# Patient Record
Sex: Female | Born: 1940 | Race: White | Hispanic: No | Marital: Married | Smoking: Never smoker
Health system: Southern US, Community
[De-identification: ages and names within clinical notes are randomized; demographics above are authoritative.]

## PROBLEM LIST (undated history)

## (undated) DIAGNOSIS — Z96619 Presence of unspecified artificial shoulder joint: Secondary | ICD-10-CM

## (undated) DIAGNOSIS — E039 Hypothyroidism, unspecified: Secondary | ICD-10-CM

## (undated) DIAGNOSIS — Z8719 Personal history of other diseases of the digestive system: Secondary | ICD-10-CM

## (undated) DIAGNOSIS — I10 Essential (primary) hypertension: Secondary | ICD-10-CM

## (undated) DIAGNOSIS — E119 Type 2 diabetes mellitus without complications: Secondary | ICD-10-CM

## (undated) DIAGNOSIS — E7800 Pure hypercholesterolemia, unspecified: Secondary | ICD-10-CM

## (undated) DIAGNOSIS — M1990 Unspecified osteoarthritis, unspecified site: Secondary | ICD-10-CM

## (undated) DIAGNOSIS — E079 Disorder of thyroid, unspecified: Secondary | ICD-10-CM

## (undated) HISTORY — DX: Presence of unspecified artificial shoulder joint: Z96.619

## (undated) HISTORY — PX: CHOLECYSTECTOMY: SHX55

## (undated) HISTORY — DX: Pure hypercholesterolemia, unspecified: E78.00

## (undated) HISTORY — PX: CATARACT EXTRACTION: SUR2

---

## 1981-10-26 HISTORY — PX: CHOLECYSTECTOMY: SHX55

## 2014-10-26 HISTORY — PX: CATARACT EXTRACTION: SUR2

## 2015-10-27 HISTORY — PX: TOTAL KNEE ARTHROPLASTY: SHX125

## 2016-10-26 HISTORY — PX: COLONOSCOPY: SHX174

## 2020-10-26 HISTORY — PX: DIAGNOSTIC MAMMOGRAM: HXRAD719

## 2021-10-26 HISTORY — PX: DG PORTABLE CHEST X RAY (ARMC HX): HXRAD1591

## 2022-02-03 ENCOUNTER — Other Ambulatory Visit: Payer: Self-pay

## 2022-02-03 ENCOUNTER — Emergency Department (HOSPITAL_BASED_OUTPATIENT_CLINIC_OR_DEPARTMENT_OTHER): Payer: MEDICARE | Admitting: Radiology

## 2022-02-03 ENCOUNTER — Emergency Department (HOSPITAL_BASED_OUTPATIENT_CLINIC_OR_DEPARTMENT_OTHER)
Admission: EM | Admit: 2022-02-03 | Discharge: 2022-02-03 | Disposition: A | Discharge status: Home / Self Care | Payer: MEDICARE | Attending: Emergency Medicine | Admitting: Emergency Medicine

## 2022-02-03 ENCOUNTER — Encounter (HOSPITAL_BASED_OUTPATIENT_CLINIC_OR_DEPARTMENT_OTHER): Payer: Self-pay | Admitting: Obstetrics and Gynecology

## 2022-02-03 DIAGNOSIS — M25511 Pain in right shoulder: Secondary | ICD-10-CM | POA: Diagnosis not present

## 2022-02-03 DIAGNOSIS — W010XXA Fall on same level from slipping, tripping and stumbling without subsequent striking against object, initial encounter: Secondary | ICD-10-CM | POA: Diagnosis not present

## 2022-02-03 DIAGNOSIS — S2001XA Contusion of right breast, initial encounter: Secondary | ICD-10-CM | POA: Diagnosis not present

## 2022-02-03 DIAGNOSIS — S299XXA Unspecified injury of thorax, initial encounter: Secondary | ICD-10-CM | POA: Diagnosis present

## 2022-02-03 HISTORY — DX: Essential (primary) hypertension: I10

## 2022-02-03 HISTORY — DX: Unspecified osteoarthritis, unspecified site: M19.90

## 2022-02-03 HISTORY — DX: Disorder of thyroid, unspecified: E07.9

## 2022-02-03 HISTORY — DX: Type 2 diabetes mellitus without complications: E11.9

## 2022-02-03 NOTE — ED Provider Notes (Signed)
?[facility] DEPT ?Provider Note ? ? ?CSN: [id number] ?Arrival date & time: 02/03/22  1139 ? ?  ? ?History ? ?Chief Complaint  ?Patient presents with  ? Fall  ? ? ?April Wheeler is a 81 y.o. female presenting after a fall last Saturday.  Patient reports that she tripped and fell onto her right anterior chest and right shoulder.  She says she did not hit her head or lose consciousness.  Is not on any blood thinners.  Says she is able to move her arm and does not have any numbness or tingling however feels as though she cannot fully flex her shoulder.  Reports taking ibuprofen for her pain and that it is somewhat helping but she continues to have pain. ? ?  ? ?Home Medications ?Prior to Admission medications   ?Not on File  ?   ? ?Allergies    ?Codeine   ? ?Review of Systems   ?Review of Systems ? ?Physical Exam ?Updated Vital Signs ?BP (!) 150/64   Pulse 71   Temp 98.4 ?F (36.9 ?C)   Resp 13   SpO2 97%  ?Physical Exam ?Vitals and nursing note reviewed.  ?Constitutional:   ?   Appearance: Normal appearance.  ?HENT:  ?   Head: Normocephalic and atraumatic.  ?Eyes:  ?   General: No scleral icterus. ?   Conjunctiva/sclera: Conjunctivae normal.  ?Pulmonary:  ?   Effort: Pulmonary effort is normal. No respiratory distress.  ?Chest:  ?   Chest wall: No tenderness.  ?Musculoskeletal:  ?   Comments: Patient with limited active extension and abduction of the right shoulder.  No obvious deformities.  ?Skin: ?   General: Skin is warm and dry.  ?   Findings: Bruising (Dime size bruise to patient's anterior right breast) present. No rash.  ?Neurological:  ?   Mental Status: She is alert.  ?Psychiatric:     ?   Mood and Affect: Mood normal.  ? ? ?ED Results / Procedures / Treatments   ?Labs ?(all labs ordered are listed, but only abnormal results are displayed) ?Labs Reviewed - No data to display ? ?EKG ?None ? ?Radiology ?DG Shoulder Right ? ?Result Date: 02/03/2022 ?CLINICAL DATA:  Pain after fall EXAM:  RIGHT SHOULDER - 2+ VIEW COMPARISON:  None. FINDINGS: There is no evidence of fracture or dislocation. Normal alignment. Joint spaces are preserved. Minimal degenerative spurring of the acromioclavicular joint. There is no evidence of arthropathy or other focal bone abnormality. Soft tissues are unremarkable. IMPRESSION: No fracture or dislocation of the right shoulder. Electronically Signed   By: Narda Rutherford M.D.   On: 02/03/2022 15:16   ? ?Procedures ?Procedures  ? ?Medications Ordered in ED ?Medications - No data to display ? ?ED Course/ Medical Decision Making/ A&P ?  ?                        ?Medical Decision Making ?Amount and/or Complexity of Data Reviewed ?Radiology: ordered. ? ? ?80 year old female presenting with a mechanical fall.  No seizure or syncopal history.  No arrhythmia.  Is adamant that she does not know. ? ?Patient with limited active range of motion of her shoulder however passive range of motion intact.  Neurovascularly intact. ? ?Imaging: Shoulder x-ray viewed by me and negative. ? ?MDM/disposition: I believe patient is stable for discharge home with orthopedic consult.  At this time she has had this injury for over a week and needs to add  Tylenol to her regimen and follow-up with the PCP and/or orthopedics.  She is agreeable to this plan.  Return precautions discussed. ? ? ?Final Clinical Impression(s) / ED Diagnoses ?Final diagnoses:  ?Acute pain of right shoulder  ? ? ?Rx / DC Orders ? ? ?Results and diagnoses were explained to the patient. Return precautions discussed in full. Patient had no additional questions and expressed complete understanding. ? ? ?This chart was dictated using voice recognition software.  Despite best efforts to proofread,  errors can occur which can change the documentation meaning.  ?  ?Saddie Benders, PA-C ?02/03/22 1620 ? ?  ?Vanetta Mulders, MD ?02/04/22 0720 ? ?

## 2022-02-03 NOTE — ED Triage Notes (Signed)
Patient reports to the ER for fall. Patient reports her right shoulder and arm hurt from the fall as well as the skin on her breast which hit a rolled up rug. Patient reports she also has back pain and that she tripped and fell. Denies hitting her head. Denies LOC. Denies blood thinners ?

## 2022-02-03 NOTE — Discharge Instructions (Addendum)
Please make an appointment with Dr. August Saucer with orthopedics for further evaluation of your shoulder. ?

## 2022-02-10 ENCOUNTER — Encounter: Payer: Self-pay | Admitting: Podiatry

## 2022-02-10 ENCOUNTER — Ambulatory Visit (INDEPENDENT_AMBULATORY_CARE_PROVIDER_SITE_OTHER): Payer: MEDICARE | Admitting: Podiatry

## 2022-02-10 DIAGNOSIS — B351 Tinea unguium: Secondary | ICD-10-CM | POA: Diagnosis not present

## 2022-02-10 DIAGNOSIS — E119 Type 2 diabetes mellitus without complications: Secondary | ICD-10-CM

## 2022-02-10 DIAGNOSIS — M79674 Pain in right toe(s): Secondary | ICD-10-CM | POA: Diagnosis not present

## 2022-02-10 DIAGNOSIS — M79675 Pain in left toe(s): Secondary | ICD-10-CM

## 2022-02-10 NOTE — Progress Notes (Signed)
This patient returns to my office for at risk foot care.  This patient requires this care by a professional since this patient will be at risk due to having diabetes with no complications.  This patient is unable to cut nails herself since the patient cannot reach her nails.These nails are painful walking and wearing shoes.  This patient presents for at risk foot care today.  General Appearance  Alert, conversant and in no acute stress.  Vascular  Dorsalis pedis and posterior tibial  pulses are palpable  bilaterally.  Capillary return is within normal limits  bilaterally. Temperature is within normal limits  bilaterally.  Neurologic  Senn-Weinstein monofilament wire test within normal limits  bilaterally. Muscle power within normal limits bilaterally.  Nails Thick disfigured discolored nails with subungual debris  from hallux to fifth toes bilaterally. No evidence of bacterial infection or drainage bilaterally.  Orthopedic  No limitations of motion  feet .  No crepitus or effusions noted.  No bony pathology or digital deformities noted. Midfoot DJD.  Skin  normotropic skin with no porokeratosis noted bilaterally.  No signs of infections or ulcers noted.     Onychomycosis  Pain in right toes  Pain in left toes  Consent was obtained for treatment procedures.   Mechanical debridement of nails 1-5  bilaterally performed with a nail nipper.  Filed with dremel without incident.    Return office visit    4 months                  Told patient to return for periodic foot care and evaluation due to potential at risk complications.   Maeola Mchaney DPM   

## 2022-03-04 ENCOUNTER — Encounter: Payer: Self-pay | Admitting: Internal Medicine

## 2022-03-04 ENCOUNTER — Ambulatory Visit: Payer: MEDICARE | Admitting: Internal Medicine

## 2022-03-04 VITALS — BP 129/58 | HR 72 | Temp 97.3°F | Ht 64.0 in | Wt 169.0 lb

## 2022-03-04 DIAGNOSIS — M1990 Unspecified osteoarthritis, unspecified site: Secondary | ICD-10-CM

## 2022-03-04 DIAGNOSIS — D509 Iron deficiency anemia, unspecified: Secondary | ICD-10-CM

## 2022-03-04 DIAGNOSIS — G8929 Other chronic pain: Secondary | ICD-10-CM | POA: Diagnosis not present

## 2022-03-04 DIAGNOSIS — E785 Hyperlipidemia, unspecified: Secondary | ICD-10-CM

## 2022-03-04 DIAGNOSIS — E1169 Type 2 diabetes mellitus with other specified complication: Secondary | ICD-10-CM | POA: Diagnosis not present

## 2022-03-04 DIAGNOSIS — E039 Hypothyroidism, unspecified: Secondary | ICD-10-CM

## 2022-03-04 NOTE — Progress Notes (Signed)
? ?Location:    ?  ?Place of Service:    ? ?Provider:  ? ?Code Status:  ?Goals of Care:  ? ?  02/03/2022  ? 12:06 PM  ?Advanced Directives  ?Does Patient Have a Medical Advance Directive? No  ?Would patient like information on creating a medical advance directive? No - Patient declined  ? ? ? ?Chief Complaint  ?Patient presents with  ? Medical Management of Chronic Issues  ? ? ?HPI: Patient is a 81 y.o. female seen today for medical management of chronic diseases.   ? ?Patient is a recent move to friend's home.  Came to establish care ?Has history of type 2 diabetes, iron deficiency anemia, hypothyroidism, chronic arthritis, hyperlipidemia ?Also has history of bilateral shoulder pain ? ?Patient had a fall few weeks ago when she tripped and fell into her right anterior chest and right shoulder.  Went to ED. x-rays were negative. ?She states only ibuprofen helps her pain her previous PCP prescribed her oral Voltaren and she has been taking that regularly. ?Her only complaint is pain in back of her neck radiating down to her chest and her shoulders ?No other issues walk with no assist lives with her husband. ? ?Past Medical History:  ?Diagnosis Date  ? Arthritis   ? Diabetes mellitus without complication ([place])   ? High cholesterol   ? Per new patient packet  ? Hypertension   ? Thyroid disease   ? ? ?Past Surgical History:  ?Procedure Laterality Date  ? CATARACT EXTRACTION    ? CHOLECYSTECTOMY    ? COLONOSCOPY  2018  ? Rondel Baton, MD/Per new patient packet  ? DG PORTABLE CHEST X RAY ([place] HX)  2023  ? Shoulder/Per new patient packet  ? DIAGNOSTIC MAMMOGRAM  2022  ? Per new patient packet  ? TOTAL KNEE ARTHROPLASTY Left 2017  ? ? ?Allergies  ?Allergen Reactions  ? Codeine Rash  ? ? ?Outpatient Encounter Medications as of 03/04/2022  ?Medication Sig  ? aspirin 81 MG EC tablet Take by mouth daily.  ? Calcium Carbonate-Vitamin D 600-5 MG-MCG TABS Take 1 tablet by mouth daily.  ? cimetidine (TAGAMET) 200 MG tablet  Take 200 mg by mouth as needed.  ? diclofenac (VOLTAREN) 50 MG EC tablet Take 50 mg by mouth daily.  ? Ferrous Sulfate (IRON) 325 (65 Fe) MG TABS Take by mouth 3 (three) times a week. Monday, Wednesday, Friday  ? levothyroxine (SYNTHROID) 100 MCG tablet Take 100 mcg by mouth daily.  ? losartan-hydrochlorothiazide (HYZAAR) 100-25 MG tablet Take 1 tablet by mouth daily.  ? metFORMIN (GLUCOPHAGE) 850 MG tablet Take 850 mg by mouth 3 (three) times daily.  ? oxybutynin (DITROPAN) 5 MG tablet Take 5 mg by mouth daily.  ? rosuvastatin (CRESTOR) 20 MG tablet Take 1 tablet by mouth daily.  ? sitaGLIPtin (JANUVIA) 100 MG tablet Take 1 tablet by mouth daily.  ? ?No facility-administered encounter medications on file as of 03/04/2022.  ? ? ?Review of Systems:  ?Review of Systems  ?Constitutional:  Negative for activity change and appetite change.  ?HENT: Negative.    ?Respiratory:  Negative for cough and shortness of breath.   ?Cardiovascular:  Negative for leg swelling.  ?Gastrointestinal:  Negative for constipation.  ?Genitourinary: Negative.   ?Musculoskeletal:  Positive for arthralgias and myalgias. Negative for gait problem.  ?Skin: Negative.   ?Neurological:  Negative for dizziness and weakness.  ?Psychiatric/Behavioral:  Negative for confusion, dysphoric mood and sleep disturbance.   ? ?Health  Maintenance  ?Topic Date Due  ? HEMOGLOBIN A1C  Never done  ? OPHTHALMOLOGY EXAM  Never done  ? Hepatitis C Screening  Never done  ? Zoster Vaccines- Shingrix (1 of 2) Never done  ? Pneumonia Vaccine 55+ Years old (1 - PCV) Never done  ? DEXA SCAN  Never done  ? INFLUENZA VACCINE  05/26/2022  ? FOOT EXAM  02/11/2023  ? TETANUS/TDAP  05/23/2031  ? COVID-19 Vaccine  Completed  ? HPV VACCINES  Aged Out  ? ? ?Physical Exam: ?Vitals:  ? 03/04/22 1437  ?BP: (!) 129/58  ?Pulse: 72  ?Temp: (!) 97.3 ?F (36.3 ?C)  ?SpO2: 96%  ?Weight: 169 lb (76.7 kg)  ?Height: 5\' 4"  (1.626 m)  ? ?Body mass index is 29.01 kg/m?Marland Kitchen ?Physical Exam ?Vitals  reviewed.  ?Constitutional:   ?   Appearance: Normal appearance.  ?HENT:  ?   Head: Normocephalic.  ?   Nose: Nose normal.  ?   Mouth/Throat:  ?   Mouth: Mucous membranes are moist.  ?   Pharynx: Oropharynx is clear.  ?Eyes:  ?   Pupils: Pupils are equal, round, and reactive to light.  ?Cardiovascular:  ?   Rate and Rhythm: Normal rate and regular rhythm.  ?   Pulses: Normal pulses.  ?   Heart sounds: Normal heart sounds. No murmur heard. ?Pulmonary:  ?   Effort: Pulmonary effort is normal.  ?   Breath sounds: Normal breath sounds.  ?Abdominal:  ?   General: Abdomen is flat. Bowel sounds are normal.  ?   Palpations: Abdomen is soft.  ?Musculoskeletal:     ?   General: No swelling.  ?   Cervical back: Neck supple.  ?   Comments: No restriction of motion in her Shoulder or Neck  ?Skin: ?   General: Skin is warm.  ?Neurological:  ?   General: No focal deficit present.  ?   Mental Status: She is alert and oriented to person, place, and time.  ?Psychiatric:     ?   Mood and Affect: Mood normal.     ?   Thought Content: Thought content normal.  ? ? ?Labs reviewed: ?Basic Metabolic Panel: ?No results for input(s): NA, K, CL, CO2, GLUCOSE, BUN, CREATININE, CALCIUM, MG, PHOS, TSH in the last 8760 hours. ?Liver Function Tests: ?No results for input(s): AST, ALT, ALKPHOS, BILITOT, PROT, ALBUMIN in the last 8760 hours. ?No results for input(s): LIPASE, AMYLASE in the last 8760 hours. ?No results for input(s): AMMONIA in the last 8760 hours. ?CBC: ?No results for input(s): WBC, NEUTROABS, HGB, HCT, MCV, PLT in the last 8760 hours. ?Lipid Panel: ?No results for input(s): CHOL, HDL, LDLCALC, TRIG, CHOLHDL, LDLDIRECT in the last 8760 hours. ?No results found for: HGBA1C ? ?Procedures since last visit: ?DG Shoulder Right ? ?Result Date: 02/03/2022 ?CLINICAL DATA:  Pain after fall EXAM: RIGHT SHOULDER - 2+ VIEW COMPARISON:  None. FINDINGS: There is no evidence of fracture or dislocation. Normal alignment. Joint spaces are preserved.  Minimal degenerative spurring of the acromioclavicular joint. There is no evidence of arthropathy or other focal bone abnormality. Soft tissues are unremarkable. IMPRESSION: No fracture or dislocation of the right shoulder. Electronically Signed   By: Keith Rake M.D.   On: 02/03/2022 15:16   ? ?Assessment/Plan ?1. Type 2 diabetes mellitus with other specified complication, without long-term current use of insulin ([place]) ?On Metformin and Januvia ? ?- Hemoglobin A1c; Future ?- COMPLETE METABOLIC PANEL WITH GFR; Future ? ?  2. Acquired hypothyroidism ?Her Synthroid dose was changed by Previous PCP on 03/23 ?Repeat TSH ?- TSH; Future ?- CBC with Differential/Platelet; Future ? ?3. Arthritis ?On Chronic Oral NSAIDS ?Says Tylenol does not help ?We discussed the side effects of gastritis and effect on renal function ?Patient will try some Tylenol as needed. ? ?4. Other chronic pain ?Takes oral nonsteroidals ? ?5. Iron deficiency anemia, unspecified iron deficiency anemia type ?Last hemoglobin was normal but her ferritin and iron sats are low.  She is up-to-date on her colonoscopy ?- CBC with Differential/Platelet; Future ? ?6. Hyperlipidemia, unspecified hyperlipidemia type ?Continue statin ?- Lipid panel; Future ? ? ? ?Labs/tests ordered:  * No order type specified * ?Next appt:  Visit date not found ? ? ? ? ?

## 2022-03-12 DIAGNOSIS — E785 Hyperlipidemia, unspecified: Secondary | ICD-10-CM

## 2022-03-12 DIAGNOSIS — E1169 Type 2 diabetes mellitus with other specified complication: Secondary | ICD-10-CM

## 2022-03-12 DIAGNOSIS — D509 Iron deficiency anemia, unspecified: Secondary | ICD-10-CM

## 2022-03-12 DIAGNOSIS — E039 Hypothyroidism, unspecified: Secondary | ICD-10-CM

## 2022-03-12 NOTE — Addendum Note (Signed)
Addended by: Cephus Richer on: 03/12/2022 02:17 PM   Modules accepted: Level of Service

## 2022-03-13 ENCOUNTER — Other Ambulatory Visit: Payer: Self-pay | Admitting: Internal Medicine

## 2022-03-13 DIAGNOSIS — E039 Hypothyroidism, unspecified: Secondary | ICD-10-CM

## 2022-03-13 LAB — CBC WITH DIFFERENTIAL/PLATELET
Absolute Monocytes: 595 cells/uL (ref 200–950)
Basophils Absolute: 40 cells/uL (ref 0–200)
Basophils Relative: 0.8 %
Eosinophils Absolute: 160 cells/uL (ref 15–500)
Eosinophils Relative: 3.2 %
HCT: 37.4 % (ref 35.0–45.0)
Hemoglobin: 12.2 g/dL (ref 11.7–15.5)
Lymphs Abs: 1950 cells/uL (ref 850–3900)
MCH: 26 pg — ABNORMAL LOW (ref 27.0–33.0)
MCHC: 32.6 g/dL (ref 32.0–36.0)
MCV: 79.7 fL — ABNORMAL LOW (ref 80.0–100.0)
MPV: 10.4 fL (ref 7.5–12.5)
Monocytes Relative: 11.9 %
Neutro Abs: 2255 cells/uL (ref 1500–7800)
Neutrophils Relative %: 45.1 %
Platelets: 299 10*3/uL (ref 140–400)
RBC: 4.69 10*6/uL (ref 3.80–5.10)
RDW: 15.1 % — ABNORMAL HIGH (ref 11.0–15.0)
Total Lymphocyte: 39 %
WBC: 5 10*3/uL (ref 3.8–10.8)

## 2022-03-13 LAB — HEMOGLOBIN A1C
Hgb A1c MFr Bld: 6.6 % of total Hgb — ABNORMAL HIGH (ref <5.7)
Mean Plasma Glucose: 143 mg/dL
eAG (mmol/L): 7.9 mmol/L

## 2022-03-13 LAB — LIPID PANEL
Cholesterol: 101 mg/dL (ref <200)
HDL: 40 mg/dL — ABNORMAL LOW (ref >=50)
LDL Cholesterol (Calc): 42 mg/dL (calc)
Non-HDL Cholesterol (Calc): 61 mg/dL (calc) (ref <130)
Total CHOL/HDL Ratio: 2.5 (calc) (ref <5.0)
Triglycerides: 109 mg/dL (ref <150)

## 2022-03-13 LAB — COMPLETE METABOLIC PANEL WITH GFR
AG Ratio: 1.7 (calc) (ref 1.0–2.5)
ALT: 18 U/L (ref 6–29)
AST: 20 U/L (ref 10–35)
Albumin: 4.2 g/dL (ref 3.6–5.1)
Alkaline phosphatase (APISO): 41 U/L (ref 37–153)
BUN: 16 mg/dL (ref 7–25)
CO2: 29 mmol/L (ref 20–32)
Calcium: 9.8 mg/dL (ref 8.6–10.4)
Chloride: 102 mmol/L (ref 98–110)
Creat: 0.8 mg/dL (ref 0.60–1.00)
Globulin: 2.5 g/dL (calc) (ref 1.9–3.7)
Glucose, Bld: 112 mg/dL — ABNORMAL HIGH (ref 65–99)
Potassium: 3.8 mmol/L (ref 3.5–5.3)
Sodium: 139 mmol/L (ref 135–146)
Total Bilirubin: 0.4 mg/dL (ref 0.2–1.2)
Total Protein: 6.7 g/dL (ref 6.1–8.1)
eGFR: 75 mL/min/{1.73_m2} (ref >=60)

## 2022-03-13 LAB — TSH: TSH: 0.23 mIU/L — ABNORMAL LOW (ref 0.40–4.50)

## 2022-03-16 ENCOUNTER — Other Ambulatory Visit: Payer: Self-pay

## 2022-03-16 NOTE — Telephone Encounter (Addendum)
Patient requested refill on medications. High alert came up when trying to fill medication. Medications pended and sent to Dr. Lyndel Safe for approval.

## 2022-03-17 ENCOUNTER — Other Ambulatory Visit: Payer: Self-pay | Admitting: *Deleted

## 2022-03-17 MED ORDER — METFORMIN HCL 850 MG PO TABS: 850.0000 mg | ORAL_TABLET | Freq: Three times a day (TID) | ORAL | 1 refills | Status: DC

## 2022-03-17 MED ORDER — LEVOTHYROXINE SODIUM 100 MCG PO TABS: 100.0000 ug | ORAL_TABLET | Freq: Every day | ORAL | 1 refills | Status: DC

## 2022-03-17 NOTE — Telephone Encounter (Signed)
Patient called requesting refill Pended Rx and sent to Dr. Gupta for approval due to HIGH ALERT Warning.  

## 2022-04-16 ENCOUNTER — Other Ambulatory Visit: Payer: Self-pay

## 2022-04-16 MED ORDER — DICLOFENAC SODIUM 50 MG PO TBEC: 50.0000 mg | DELAYED_RELEASE_TABLET | Freq: Every day | ORAL | 0 refills | Status: DC

## 2022-05-07 DIAGNOSIS — E039 Hypothyroidism, unspecified: Secondary | ICD-10-CM

## 2022-05-08 LAB — TSH: TSH: 0.43 mIU/L (ref 0.40–4.50)

## 2022-06-03 ENCOUNTER — Non-Acute Institutional Stay: Payer: MEDICARE | Admitting: Internal Medicine

## 2022-06-03 ENCOUNTER — Telehealth: Payer: Self-pay | Admitting: *Deleted

## 2022-06-03 ENCOUNTER — Encounter: Payer: Self-pay | Admitting: Internal Medicine

## 2022-06-03 VITALS — BP 132/57 | HR 84 | Temp 97.3°F | Ht 64.0 in | Wt 167.9 lb

## 2022-06-03 DIAGNOSIS — G8929 Other chronic pain: Secondary | ICD-10-CM

## 2022-06-03 DIAGNOSIS — E039 Hypothyroidism, unspecified: Secondary | ICD-10-CM | POA: Diagnosis not present

## 2022-06-03 DIAGNOSIS — M25512 Pain in left shoulder: Secondary | ICD-10-CM

## 2022-06-03 DIAGNOSIS — E785 Hyperlipidemia, unspecified: Secondary | ICD-10-CM

## 2022-06-03 DIAGNOSIS — M25511 Pain in right shoulder: Secondary | ICD-10-CM

## 2022-06-03 DIAGNOSIS — E1169 Type 2 diabetes mellitus with other specified complication: Secondary | ICD-10-CM | POA: Diagnosis not present

## 2022-06-03 DIAGNOSIS — E119 Type 2 diabetes mellitus without complications: Secondary | ICD-10-CM

## 2022-06-03 DIAGNOSIS — D509 Iron deficiency anemia, unspecified: Secondary | ICD-10-CM

## 2022-06-03 MED ORDER — SITAGLIPTIN PHOSPHATE 100 MG PO TABS: 100.0000 mg | ORAL_TABLET | Freq: Every day | ORAL | 3 refills | Status: DC

## 2022-06-03 MED ORDER — COLESTIPOL HCL 1 G PO TABS: 1.0000 g | ORAL_TABLET | Freq: Every day | ORAL | 0 refills | Status: DC

## 2022-06-03 NOTE — Telephone Encounter (Signed)
Received fax from [facility] with [facility]. [phone] Patient needs Surgery Clearance for Right Shoulder Reverse Arthroplasty.   Patient had appointment today 8/9. Dr. Chales Abrahams filled out the form and faxed to [facility] Fax: [phone]  Copy sent to [place].

## 2022-06-12 ENCOUNTER — Encounter: Payer: Self-pay | Admitting: Podiatry

## 2022-06-12 ENCOUNTER — Ambulatory Visit (INDEPENDENT_AMBULATORY_CARE_PROVIDER_SITE_OTHER): Payer: MEDICARE | Admitting: Podiatry

## 2022-06-12 DIAGNOSIS — M79675 Pain in left toe(s): Secondary | ICD-10-CM | POA: Diagnosis not present

## 2022-06-12 DIAGNOSIS — E119 Type 2 diabetes mellitus without complications: Secondary | ICD-10-CM

## 2022-06-12 DIAGNOSIS — M79674 Pain in right toe(s): Secondary | ICD-10-CM

## 2022-06-12 DIAGNOSIS — B351 Tinea unguium: Secondary | ICD-10-CM

## 2022-06-12 NOTE — Progress Notes (Signed)
This patient returns to my office for at risk foot care.  This patient requires this care by a professional since this patient will be at risk due to having diabetes with no complications.  This patient is unable to cut nails herself since the patient cannot reach her nails.These nails are painful walking and wearing shoes.  This patient presents for at risk foot care today.  General Appearance  Alert, conversant and in no acute stress.  Vascular  Dorsalis pedis and posterior tibial  pulses are palpable  bilaterally.  Capillary return is within normal limits  bilaterally. Temperature is within normal limits  bilaterally.  Neurologic  Senn-Weinstein monofilament wire test within normal limits  bilaterally. Muscle power within normal limits bilaterally.  Nails Thick disfigured discolored nails with subungual debris  from hallux to fifth toes bilaterally. No evidence of bacterial infection or drainage bilaterally.  Orthopedic  No limitations of motion  feet .  No crepitus or effusions noted.  No bony pathology or digital deformities noted. Midfoot DJD.  Skin  normotropic skin with no porokeratosis noted bilaterally.  No signs of infections or ulcers noted.     Onychomycosis  Pain in right toes  Pain in left toes  Consent was obtained for treatment procedures.   Mechanical debridement of nails 1-5  bilaterally performed with a nail nipper.  Filed with dremel without incident.    Return office visit    4 months                  Told patient to return for periodic foot care and evaluation due to potential at risk complications.   Jasmen Emrich DPM   

## 2022-06-14 NOTE — Progress Notes (Signed)
Location:  [facility] of Service:  Clinic (12)  Provider: Mahlon Gammon   Code Status:  Goals of Care:     02/03/2022   12:06 PM  Advanced Directives  Does Patient Have a Medical Advance Directive? No  Would patient like information on creating a medical advance directive? No - Patient declined     Chief Complaint  Patient presents with   Medication Management    Patient returns to the clinic for follow up.     HPI: Patient is a 81 y.o. female seen today for medical management of chronic diseases.    Patient is a recent move to friend's home.   Has history of type 2 diabetes, iron deficiency anemia, hypothyroidism, chronic arthritis, hyperlipidemia Also has history of bilateral shoulder pain   Patient had a fall on 04/11  when she tripped and fell into her right anterior chest and right shoulder.  Since then having Right Shoulder pain Also Has chronic Left Shoulder pain Seeing Dr Rennis Chris Referal from Dr Darrelyn Hillock Has rotator cuff tear arthropathies And would need Reverse Shoulder arthroplasty Was on Prednisone for pain and Also got Steroid shot Plan to get surgery in Oct Patient does not have help at home as husband is diable also She is going to think about coming to Rehab for few weeks till recovery Continues to work with therapy  Past Medical History:  Diagnosis Date   Arthritis    Diabetes mellitus without complication (H[facility])    High cholesterol    Per new patient packet   Hypertension    Thyroid disease     Past Surgical History:  Procedure Laterality Date   CATARACT EXTRACTION     CHOLECYSTECTOMY     COLONOSCOPY  2018   Octavia Bruckner, MD/Per new patient packet   DG PORTABLE CHEST X RAY ([facility] HX)  2023   Shoulder/Per new patient packet   DIAGNOSTIC MAMMOGRAM  2022   Per new patient packet   TOTAL KNEE ARTHROPLASTY Left 2017    Allergies  Allergen Reactions   Codeine Rash    Outpatient Encounter Medications as of 06/03/2022   Medication Sig   Calcium Carbonate-Vitamin D 600-5 MG-MCG TABS Take 1 tablet by mouth daily.   cimetidine (TAGAMET) 200 MG tablet Take 200 mg by mouth as needed.   colestipol (COLESTID) 1 g tablet Take 1 tablet (1 g total) by mouth daily.   famotidine (PEPCID) 20 MG tablet Take 1 tablet by mouth daily.   Ferrous Sulfate (IRON) 325 (65 Fe) MG TABS Take by mouth 3 (three) times a week. Monday, Wednesday, Friday   ibuprofen (ADVIL) 200 MG tablet Take 200 mg by mouth daily.   levothyroxine (SYNTHROID) 100 MCG tablet Take 1 tablet (100 mcg total) by mouth daily.   losartan-hydrochlorothiazide (HYZAAR) 100-25 MG tablet Take 1 tablet by mouth daily.   Menthol, Topical Analgesic, (BIOFREEZE) 10 % CREA Apply 1 Application topically daily.   metFORMIN (GLUCOPHAGE) 850 MG tablet Take 1 tablet (850 mg total) by mouth 3 (three) times daily.   oxybutynin (DITROPAN) 5 MG tablet Take 5 mg by mouth daily.   rosuvastatin (CRESTOR) 20 MG tablet Take 1 tablet by mouth daily.   [DISCONTINUED] sitaGLIPtin (JANUVIA) 100 MG tablet Take 1 tablet by mouth daily.   sitaGLIPtin (JANUVIA) 100 MG tablet Take 1 tablet (100 mg total) by mouth daily.   [DISCONTINUED] aspirin 81 MG EC tablet Take by mouth daily.   [DISCONTINUED] diclofenac (VOLTAREN)  50 MG EC tablet Take 1 tablet (50 mg total) by mouth daily.   No facility-administered encounter medications on file as of 06/03/2022.    Review of Systems:  Review of Systems  Constitutional:  Positive for activity change. Negative for appetite change.  HENT: Negative.    Respiratory:  Negative for cough and shortness of breath.   Cardiovascular:  Negative for leg swelling.  Gastrointestinal:  Negative for constipation.  Genitourinary: Negative.   Musculoskeletal:  Positive for arthralgias and myalgias. Negative for gait problem.  Skin: Negative.   Neurological:  Negative for dizziness and weakness.  Psychiatric/Behavioral:  Negative for confusion, dysphoric mood and  sleep disturbance.     Health Maintenance  Topic Date Due   OPHTHALMOLOGY EXAM  Never done   Hepatitis C Screening  Never done   Zoster Vaccines- Shingrix (1 of 2) Never done   Pneumonia Vaccine 84+ Years old (1 - PCV) Never done   DEXA SCAN  Never done   INFLUENZA VACCINE  05/26/2022   HEMOGLOBIN A1C  09/12/2022   FOOT EXAM  02/11/2023   TETANUS/TDAP  05/23/2031   COVID-19 Vaccine  Completed   HPV VACCINES  Aged Out    Physical Exam: Vitals:   06/03/22 1508  BP: (!) 132/57  Pulse: 84  Temp: (!) 97.3 F (36.3 C)  SpO2: 96%  Weight: 167 lb 14.4 oz (76.2 kg)  Height: 5\' 4"  (1.626 m)   Body mass index is 28.82 kg/m. Physical Exam Vitals reviewed.  Constitutional:      Appearance: Normal appearance.  HENT:     Head: Normocephalic.     Nose: Nose normal.     Mouth/Throat:     Mouth: Mucous membranes are moist.     Pharynx: Oropharynx is clear.  Eyes:     Pupils: Pupils are equal, round, and reactive to light.  Cardiovascular:     Rate and Rhythm: Normal rate and regular rhythm.     Pulses: Normal pulses.     Heart sounds: Normal heart sounds. No murmur heard. Pulmonary:     Effort: Pulmonary effort is normal.     Breath sounds: Normal breath sounds.  Abdominal:     General: Abdomen is flat. Bowel sounds are normal.     Palpations: Abdomen is soft.  Musculoskeletal:        General: No swelling.     Cervical back: Neck supple.     Comments: Pain in Both her Shoulders   Skin:    General: Skin is warm.  Neurological:     General: No focal deficit present.     Mental Status: She is alert and oriented to person, place, and time.  Psychiatric:        Mood and Affect: Mood normal.        Thought Content: Thought content normal.     Labs reviewed: Basic Metabolic Panel: Recent Labs    03/12/22 0830 05/07/22 0840  NA 139  --   K 3.8  --   CL 102  --   CO2 29  --   GLUCOSE 112*  --   BUN 16  --   CREATININE 0.80  --   CALCIUM 9.8  --   TSH 0.23* 0.43    Liver Function Tests: Recent Labs    03/12/22 0830  AST 20  ALT 18  BILITOT 0.4  PROT 6.7   No results for input(s): "LIPASE", "AMYLASE" in the last 8760 hours. No results for input(s): "AMMONIA" in  the last 8760 hours. CBC: Recent Labs    03/12/22 0830  WBC 5.0  NEUTROABS 2,255  HGB 12.2  HCT 37.4  MCV 79.7*  PLT 299   Lipid Panel: Recent Labs    03/12/22 0830  CHOL 101  HDL 40*  LDLCALC 42  TRIG 109  CHOLHDL 2.5   Lab Results  Component Value Date   HGBA1C 6.6 (H) 03/12/2022    Procedures since last visit: No results found.  Assessment/Plan 1. Chronic pain of both shoulders Patient planning to get Arthoplasty in her Right Shoulder by Dr Onnie Graham I have filled her Preop Form We talked about coming to Rehab as her husband cannot help her She is looking at her options  2. Acquired hypothyroidism TSH normal  3. Type 2 diabetes mellitus with other specified complication, without long-term current use of insulin (HCC) A1C good Hold Drugs on day of surgery  4. Iron deficiency anemia, unspecified iron deficiency anemia type Continue Iron Last hemoglobin was normal but her ferritin and iron sats are low.  She is up-to-date on her colonoscopy 5. Hyperlipidemia, unspecified hyperlipidemia type LDL good on statin  6 Arthritis On Chronic Oral NSAIDS Says Tylenol does not help We discussed the side effects of gastritis and effect on renal function Patient will try some Tylenol as needed. 7 Diarrhea She has had this before Wants to renew her prescription of Colestid If no improvement consider GI referal  Labs/tests ordered:  * No order type specified *  Virgie Dad, MD

## 2022-06-17 ENCOUNTER — Encounter: Payer: MEDICARE | Admitting: Internal Medicine

## 2022-06-29 ENCOUNTER — Other Ambulatory Visit: Payer: Self-pay | Admitting: Internal Medicine

## 2022-07-20 NOTE — Patient Instructions (Addendum)
DUE TO COVID-19 ONLY TWO VISITORS  (aged 316 and older)  ARE ALLOWED TO COME WITH YOU AND STAY IN THE WAITING ROOM ONLY DURING PRE OP AND PROCEDURE.   **NO VISITORS ARE ALLOWED IN THE SHORT STAY AREA OR RECOVERY ROOM!!**  IF YOU WILL BE ADMITTED INTO THE HOSPITAL YOU ARE ALLOWED ONLY FOUR SUPPORT PEOPLE DURING VISITATION HOURS ONLY (7 AM -8PM)   The support person(s) must pass our screening, gel in and out, and wear a mask at all times, including in the patient's room. Patients must also wear a mask when staff or their support person are in the room. Visitors GUEST BADGE MUST BE WORN VISIBLY  One adult visitor may remain with you overnight and MUST be in the room by 8 P.M.     Your procedure is scheduled on: 07/30/22   [facility]    Report to admitting at  7:30 AM   Call this number if you have problems the morning of surgery [phone]   Do not eat food :After Midnight.   After Midnight you may have the following liquids until _7:00_____ AM/  DAY OF SURGERY  Water Black Coffee (sugar ok, NO MILK/CREAM OR CREAMERS)  Tea (sugar ok, NO MILK/CREAM OR CREAMERS) regular and decaf                             Plain Jell-O (NO RED)                                           Fruit ices (not with fruit pulp, NO RED)                                     Popsicles (NO RED)                                                                  Juice: apple, WHITE grape, WHITE cranberry Sports drinks like Gatorade (NO RED)                   The day of surgery:  Drink ONE  G2 at  6:45 AM the morning of surgery. Drink in one sitting. Do not sip.  This drink was given to you during your hospital  pre-op appointment visit. Nothing else to drink after completing the   G2. At 7:00 AM          If you have questions, please contact your surgeon's office.     Oral Hygiene is also important to reduce your risk of infection.                                    Remember -  BRUSH YOUR TEETH THE MORNING OF SURGERY WITH YOUR REGULAR TOOTHPASTE   Do NOT smoke after Midnight   Take these medicines the morning of surgery with A SIP OF WATER: Colestipol -Colestid  Levothyroxine- Synthroid                                                                                                                             Stop Januvia on 07/27/22  DO NOT TAKE ANY ORAL DIABETIC MEDICATIONS DAY OF YOUR SURGERY Metformin,   Bring CPAP mask and tubing day of surgery.                              You may not have any metal on your body including hair pins, jewelry, and body piercing             Do not wear make-up, lotions, powders, perfumes/cologne, or deodorant  Do not wear nail polish including gel and S&S, artificial/acrylic nails, or any other type of covering on natural nails including finger and toenails. If you have artificial nails, gel coating, etc. that needs to be removed by a nail salon please have this removed prior to surgery or surgery may need to be canceled/ delayed if the surgeon/ anesthesia feels like they are unable to be safely monitored.   Do not shave  48 hours prior to surgery.     Do not bring valuables to the hospital. [facility] IS NOT             RESPONSIBLE   FOR VALUABLES.   Contacts, dentures or bridgework may not be worn into surgery.   DO NOT BRING YOUR HOME MEDICATIONS TO THE HOSPITAL.     Patients discharged on the day of surgery will not be allowed to drive home.  Someone NEEDS to stay with you for the first 24 hours after anesthesia.   Special Instructions: Bring a copy of your healthcare power of attorney and living will documents  the day of surgery if you haven't scanned them before.              Please read over the following fact sheets you were given: IF YOU HAVE QUESTIONS ABOUT YOUR PRE-OP INSTRUCTIONS PLEASE  CALL [phone] [facility]- Preparing for Total Shoulder Arthroplasty    Before surgery, you can play an important role. Because skin is not sterile, your skin needs to be as free of germs as possible. You can reduce the number of germs on your skin by using the following products. Benzoyl Peroxide Gel Reduces the number of germs present on the skin Applied twice a day to shoulder area starting two days before surgery    ==================================================================  Please follow these instructions carefully:  BENZOYL PEROXIDE 5% GEL  Please do not use if you have an allergy to benzoyl peroxide.   If your skin becomes reddened/irritated stop using the benzoyl peroxide.  Starting two days before surgery, apply as follows: Apply benzoyl peroxide in the morning and at night. Apply after taking a shower. If you are not taking a shower clean entire shoulder front, back, and side  along with the armpit with a clean wet washcloth.  Place a quarter-sized dollop on your shoulder and rub in thoroughly, making sure to cover the front, back, and side of your shoulder, along with the armpit.   2 days before ____ AM   ____ PM              1 day before ____ AM   ____ PM                         Do this twice a day for two days.  (Last application is the night before surgery, AFTER using the CHG soap as described below).  Do NOT apply benzoyl peroxide gel on the day of surgery.      [facility] - Preparing for Surgery Before surgery, you can play an important role.  Because skin is not sterile, your skin needs to be as free of germs as possible.  You can reduce the number of germs on your skin by washing with CHG (chlorahexidine gluconate) soap before surgery.  CHG is an antiseptic cleaner which kills germs and bonds with the skin to continue killing germs even after washing. Please DO NOT use if you have an allergy to CHG or antibacterial soaps.  If your skin becomes  reddened/irritated stop using the CHG and inform your nurse when you arrive at Short Stay. Do not shave (including legs and underarms) for at least 48 hours prior to the first CHG shower.   Please follow these instructions carefully:  1.  Shower with CHG Soap the night before surgery and the  morning of Surgery.  2.  If you choose to wash your hair, wash your hair first as usual with your  normal  shampoo.  3.  After you shampoo, rinse your hair and body thoroughly to remove the  shampoo.                            4.  Use CHG as you would any other liquid soap.  You can apply chg directly  to the skin and wash                       Gently with a scrungie or clean washcloth.  5.  Apply the CHG Soap to your body ONLY FROM THE NECK DOWN.   Do not use on face/ open                           Wound or open sores. Avoid contact with eyes, ears mouth and genitals (private parts).                       Wash face,  Genitals (private parts) with your normal soap.             6.  Wash thoroughly, paying special attention to the area where your surgery  will be performed.  7.  Thoroughly rinse your body with warm water from the neck down.  8.  DO NOT shower/wash with your normal soap after using and rinsing off  the CHG Soap.                9.  Pat yourself dry with a clean towel.            10.  Wear clean pajamas.  11.  Place clean sheets on your bed the night of your first shower and do not  sleep with pets. Day of Surgery : Do not apply any lotions/deodorants the morning of surgery.  Please wear clean clothes to the hospital/surgery center.  FAILURE TO FOLLOW THESE INSTRUCTIONS MAY RESULT IN THE CANCELLATION OF YOUR SURGERY   ________________________________________________________________________   Incentive Spirometer  An incentive spirometer is a tool that can help keep your lungs clear and active. This tool measures how well you are filling your lungs with each breath. Taking long  deep breaths may help reverse or decrease the chance of developing breathing (pulmonary) problems (especially infection) following: A long period of time when you are unable to move or be active. BEFORE THE PROCEDURE  If the spirometer includes an indicator to show your best effort, your nurse or respiratory therapist will set it to a desired goal. If possible, sit up straight or lean slightly forward. Try not to slouch. Hold the incentive spirometer in an upright position. INSTRUCTIONS FOR USE  Sit on the edge of your bed if possible, or sit up as far as you can in bed or on a chair. Hold the incentive spirometer in an upright position. Breathe out normally. Place the mouthpiece in your mouth and seal your lips tightly around it. Breathe in slowly and as deeply as possible, raising the piston or the ball toward the top of the column. Hold your breath for 3-5 seconds or for as long as possible. Allow the piston or ball to fall to the bottom of the column. Remove the mouthpiece from your mouth and breathe out normally. Rest for a few seconds and repeat Steps 1 through 7 at least 10 times every 1-2 hours when you are awake. Take your time and take a few normal breaths between deep breaths. The spirometer may include an indicator to show your best effort. Use the indicator as a goal to work toward during each repetition. After each set of 10 deep breaths, practice coughing to be sure your lungs are clear. If you have an incision (the cut made at the time of surgery), support your incision when coughing by placing a pillow or rolled up towels firmly against it. Once you are able to get out of bed, walk around indoors and cough well. You may stop using the incentive spirometer when instructed by your caregiver.  RISKS AND COMPLICATIONS Take your time so you do not get dizzy or light-headed. If you are in pain, you may need to take or ask for pain medication before doing incentive spirometry. It is  harder to take a deep breath if you are having pain. AFTER USE Rest and breathe slowly and easily. It can be helpful to keep track of a log of your progress. Your caregiver can provide you with a simple table to help with this. If you are using the spirometer at home, follow these instructions: SEEK MEDICAL CARE IF:  You are having difficultly using the spirometer. You have trouble using the spirometer as often as instructed. Your pain medication is not giving enough relief while using the spirometer. You develop fever of 100.5 F (38.1 C) or higher. SEEK IMMEDIATE MEDICAL CARE IF:  You cough up bloody sputum that had not been present before. You develop fever of 102 F (38.9 C) or greater. You develop worsening pain at or near the incision site. MAKE SURE YOU:  Understand these instructions. Will watch your condition. Will get help right away  if you are not doing well or get worse. Document Released: 02/22/2007 Document Revised: 01/04/2012 Document Reviewed: 04/25/2007 [facility] Patient Information 2014 [place], [place].   ________________________________________________________________________

## 2022-07-24 ENCOUNTER — Encounter (HOSPITAL_COMMUNITY)
Admission: RE | Admit: 2022-07-24 | Discharge: 2022-07-24 | Disposition: A | Discharge status: Home / Self Care | Payer: MEDICARE | Source: Ambulatory Visit | Attending: Orthopedic Surgery | Admitting: Orthopedic Surgery

## 2022-07-24 ENCOUNTER — Other Ambulatory Visit: Payer: Self-pay

## 2022-07-24 ENCOUNTER — Encounter (HOSPITAL_COMMUNITY): Payer: Self-pay

## 2022-07-24 DIAGNOSIS — M79674 Pain in right toe(s): Secondary | ICD-10-CM | POA: Diagnosis not present

## 2022-07-24 DIAGNOSIS — B351 Tinea unguium: Secondary | ICD-10-CM

## 2022-07-24 DIAGNOSIS — M79675 Pain in left toe(s): Secondary | ICD-10-CM | POA: Diagnosis not present

## 2022-07-24 DIAGNOSIS — Z01818 Encounter for other preprocedural examination: Secondary | ICD-10-CM | POA: Diagnosis not present

## 2022-07-24 HISTORY — DX: Personal history of other diseases of the digestive system: Z87.19

## 2022-07-24 HISTORY — DX: Hypothyroidism, unspecified: E03.9

## 2022-07-24 LAB — CBC
HCT: 37.8 % (ref 36.0–46.0)
Hemoglobin: 12.1 g/dL (ref 12.0–15.0)
MCH: 26.3 pg (ref 26.0–34.0)
MCHC: 32 g/dL (ref 30.0–36.0)
MCV: 82.2 fL (ref 80.0–100.0)
Platelets: 329 10*3/uL (ref 150–400)
RBC: 4.6 MIL/uL (ref 3.87–5.11)
RDW: 15.2 % (ref 11.5–15.5)
WBC: 7.3 10*3/uL (ref 4.0–10.5)
nRBC: 0 % (ref 0.0–0.2)

## 2022-07-24 LAB — BASIC METABOLIC PANEL
Anion gap: 7 (ref 5–15)
BUN: 15 mg/dL (ref 8–23)
CO2: 26 mmol/L (ref 22–32)
Calcium: 9.8 mg/dL (ref 8.9–10.3)
Chloride: 105 mmol/L (ref 98–111)
Creatinine, Ser: 0.8 mg/dL (ref 0.44–1.00)
GFR, Estimated: >60 mL/min (ref >60)
Glucose, Bld: 138 mg/dL — ABNORMAL HIGH (ref 70–99)
Potassium: 3.6 mmol/L (ref 3.5–5.1)
Sodium: 138 mmol/L (ref 135–145)

## 2022-07-24 LAB — HEMOGLOBIN A1C
Hgb A1c MFr Bld: 6.2 % — ABNORMAL HIGH (ref 4.8–5.6)
Mean Plasma Glucose: 131.24 mg/dL

## 2022-07-24 LAB — GLUCOSE, CAPILLARY: Glucose-Capillary: 136 mg/dL — ABNORMAL HIGH (ref 70–99)

## 2022-07-24 LAB — SURGICAL PCR SCREEN
MRSA, PCR: NEGATIVE
Staphylococcus aureus: NEGATIVE

## 2022-07-24 NOTE — Progress Notes (Signed)
Anesthesia note:  Bowel prep reminder:NA  PCP - Dr. Jonetta Speak Cardiologist -none Other-   Chest x-ray - no EKG - 07/24/22-chart Stress Test - no ECHO - no Cardiac Cath - no  Pacemaker/ICD device last checked:NA  Sleep Study - NA CPAP -    Fasting Blood Sugar - 120-158 Checks Blood Sugar __QD Pt told to hold Januvia for 3 days and be strict with her diet___  Blood Thinner:NA Blood Thinner Instructions: Aspirin Instructions: Last Dose:  Anesthesia review: no  Patient denies shortness of breath, fever, cough and chest pain at PAT appointment Pt has no SOB with activities  Patient verbalized understanding of instructions that were given to them at the PAT appointment. Patient was also instructed that they will need to review over the PAT instructions again at home before surgery. yes

## 2022-07-29 ENCOUNTER — Encounter (HOSPITAL_COMMUNITY): Payer: Self-pay | Admitting: Orthopedic Surgery

## 2022-07-29 NOTE — Anesthesia Preprocedure Evaluation (Signed)
Anesthesia Evaluation  Patient identified by MRN, date of birth, ID band Patient awake    Reviewed: Allergy & Precautions, NPO status , Patient's Chart, lab work & pertinent test results  History of Anesthesia Complications Negative for: history of anesthetic complications  Airway Mallampati: II  TM Distance: >3 FB Neck ROM: Full    Dental  (+) Partial Lower, Dental Advisory Given   Pulmonary neg pulmonary ROS,    breath sounds clear to auscultation       Cardiovascular hypertension, Pt. on medications  Rhythm:Regular Rate:Normal     Neuro/Psych negative neurological ROS     GI/Hepatic Neg liver ROS, hiatal hernia, GERD  Medicated,  Endo/Other  diabetes (glu 138), Oral Hypoglycemic AgentsHypothyroidism BMI 30  Renal/GU negative Renal ROS     Musculoskeletal  (+) Arthritis ,   Abdominal (+) + obese,   Peds  Hematology negative hematology ROS (+)   Anesthesia Other Findings   Reproductive/Obstetrics                            Anesthesia Physical Anesthesia Plan  ASA: 3  Anesthesia Plan: General   Post-op Pain Management: Regional block* and Tylenol PO (pre-op)*   Induction: Intravenous  PONV Risk Score and Plan: 3 and Ondansetron, Dexamethasone and Treatment may vary due to age or medical condition  Airway Management Planned: Oral ETT  Additional Equipment: None  Intra-op Plan:   Post-operative Plan: Extubation in OR  Informed Consent: I have reviewed the patients History and Physical, chart, labs and discussed the procedure including the risks, benefits and alternatives for the proposed anesthesia with the patient or authorized representative who has indicated his/her understanding and acceptance.     Dental advisory given  Plan Discussed with: CRNA and Surgeon  Anesthesia Plan Comments: (Plan routine monitors, GA with interscalene block for post op analgesia)        Anesthesia Quick Evaluation

## 2022-07-30 ENCOUNTER — Ambulatory Visit (HOSPITAL_COMMUNITY)
Admission: RE | Admit: 2022-07-30 | Discharge: 2022-07-30 | Disposition: A | Discharge status: Home / Self Care | Payer: MEDICARE | Attending: Orthopedic Surgery | Admitting: Orthopedic Surgery

## 2022-07-30 ENCOUNTER — Encounter (HOSPITAL_COMMUNITY)
Admission: RE | Disposition: A | Discharge status: Home / Self Care | Payer: Self-pay | Source: Home / Self Care | Attending: Orthopedic Surgery

## 2022-07-30 ENCOUNTER — Ambulatory Visit (HOSPITAL_BASED_OUTPATIENT_CLINIC_OR_DEPARTMENT_OTHER): Payer: MEDICARE | Admitting: Anesthesiology

## 2022-07-30 ENCOUNTER — Encounter (HOSPITAL_COMMUNITY): Payer: Self-pay | Admitting: Orthopedic Surgery

## 2022-07-30 ENCOUNTER — Other Ambulatory Visit: Payer: Self-pay

## 2022-07-30 ENCOUNTER — Ambulatory Visit (HOSPITAL_COMMUNITY): Payer: MEDICARE | Admitting: Anesthesiology

## 2022-07-30 DIAGNOSIS — Z01818 Encounter for other preprocedural examination: Secondary | ICD-10-CM

## 2022-07-30 DIAGNOSIS — Z7984 Long term (current) use of oral hypoglycemic drugs: Secondary | ICD-10-CM | POA: Insufficient documentation

## 2022-07-30 DIAGNOSIS — Z96611 Presence of right artificial shoulder joint: Secondary | ICD-10-CM

## 2022-07-30 DIAGNOSIS — B351 Tinea unguium: Secondary | ICD-10-CM

## 2022-07-30 DIAGNOSIS — E119 Type 2 diabetes mellitus without complications: Secondary | ICD-10-CM

## 2022-07-30 DIAGNOSIS — I10 Essential (primary) hypertension: Secondary | ICD-10-CM

## 2022-07-30 DIAGNOSIS — M12811 Other specific arthropathies, not elsewhere classified, right shoulder: Secondary | ICD-10-CM

## 2022-07-30 DIAGNOSIS — K219 Gastro-esophageal reflux disease without esophagitis: Secondary | ICD-10-CM | POA: Insufficient documentation

## 2022-07-30 DIAGNOSIS — M75101 Unspecified rotator cuff tear or rupture of right shoulder, not specified as traumatic: Secondary | ICD-10-CM | POA: Insufficient documentation

## 2022-07-30 DIAGNOSIS — E669 Obesity, unspecified: Secondary | ICD-10-CM | POA: Insufficient documentation

## 2022-07-30 DIAGNOSIS — Z6830 Body mass index (BMI) 30.0-30.9, adult: Secondary | ICD-10-CM | POA: Insufficient documentation

## 2022-07-30 DIAGNOSIS — E039 Hypothyroidism, unspecified: Secondary | ICD-10-CM | POA: Insufficient documentation

## 2022-07-30 HISTORY — DX: Presence of right artificial shoulder joint: Z96.611

## 2022-07-30 HISTORY — PX: REVERSE SHOULDER ARTHROPLASTY: SHX5054

## 2022-07-30 HISTORY — PX: TOTAL SHOULDER ARTHROPLASTY: SHX126

## 2022-07-30 LAB — GLUCOSE, CAPILLARY
Glucose-Capillary: 138 mg/dL — ABNORMAL HIGH (ref 70–99)
Glucose-Capillary: 131 mg/dL — ABNORMAL HIGH (ref 70–99)

## 2022-07-30 SURGERY — ARTHROPLASTY, SHOULDER, TOTAL, REVERSE
Anesthesia: General | Site: Shoulder | Laterality: Right

## 2022-07-30 MED ORDER — LIDOCAINE 2% (20 MG/ML) 5 ML SYRINGE
INTRAMUSCULAR | Status: DC | PRN
  Administered 2022-07-30: 80 mg via INTRAVENOUS

## 2022-07-30 MED ORDER — ORAL CARE MOUTH RINSE: 15.0000 mL | Freq: Once | OROMUCOSAL | Status: AC

## 2022-07-30 MED ORDER — MIDAZOLAM HCL 2 MG/2ML IJ SOLN
1.0000 mg | Freq: Once | INTRAMUSCULAR | Status: AC
  Administered 2022-07-30: 1 mg via INTRAVENOUS
  Filled 2022-07-30: qty 2

## 2022-07-30 MED ORDER — OXYCODONE-ACETAMINOPHEN 5-325 MG PO TABS: 1.0000 | ORAL_TABLET | ORAL | 0 refills | Status: DC | PRN

## 2022-07-30 MED ORDER — 0.9 % SODIUM CHLORIDE (POUR BTL) OPTIME
TOPICAL | Status: DC | PRN
  Administered 2022-07-30: 1000 mL

## 2022-07-30 MED ORDER — ACETAMINOPHEN 500 MG PO TABS
1000.0000 mg | ORAL_TABLET | Freq: Once | ORAL | Status: AC
  Administered 2022-07-30: 1000 mg via ORAL
  Filled 2022-07-30: qty 2

## 2022-07-30 MED ORDER — FENTANYL CITRATE (PF) 100 MCG/2ML IJ SOLN
INTRAMUSCULAR | Status: AC
  Filled 2022-07-30: qty 2

## 2022-07-30 MED ORDER — BUPIVACAINE-EPINEPHRINE (PF) 0.5% -1:200000 IJ SOLN
INTRAMUSCULAR | Status: DC | PRN
  Administered 2022-07-30: 10 mL via PERINEURAL

## 2022-07-30 MED ORDER — OXYCODONE HCL 5 MG/5ML PO SOLN: 5.0000 mg | Freq: Once | ORAL | Status: DC | PRN

## 2022-07-30 MED ORDER — CHLORHEXIDINE GLUCONATE 0.12 % MT SOLN
15.0000 mL | Freq: Once | OROMUCOSAL | Status: AC
  Administered 2022-07-30: 15 mL via OROMUCOSAL

## 2022-07-30 MED ORDER — PROPOFOL 10 MG/ML IV BOLUS
INTRAVENOUS | Status: DC | PRN
  Administered 2022-07-30: 120 mg via INTRAVENOUS

## 2022-07-30 MED ORDER — MEPERIDINE HCL 50 MG/ML IJ SOLN: 6.2500 mg | INTRAMUSCULAR | Status: DC | PRN

## 2022-07-30 MED ORDER — BUPIVACAINE LIPOSOME 1.3 % IJ SUSP
INTRAMUSCULAR | Status: DC | PRN
  Administered 2022-07-30: 10 mL via PERINEURAL

## 2022-07-30 MED ORDER — FENTANYL CITRATE (PF) 100 MCG/2ML IJ SOLN
INTRAMUSCULAR | Status: DC | PRN
  Administered 2022-07-30: 50 ug via INTRAVENOUS
  Administered 2022-07-30 (×2): 25 ug via INTRAVENOUS

## 2022-07-30 MED ORDER — CEFAZOLIN SODIUM-DEXTROSE 2-4 GM/100ML-% IV SOLN
2.0000 g | INTRAVENOUS | Status: AC
  Administered 2022-07-30: 2 g via INTRAVENOUS
  Filled 2022-07-30: qty 100

## 2022-07-30 MED ORDER — VANCOMYCIN HCL 1000 MG IV SOLR
INTRAVENOUS | Status: AC
  Filled 2022-07-30: qty 20

## 2022-07-30 MED ORDER — FENTANYL CITRATE PF 50 MCG/ML IJ SOSY
25.0000 ug | PREFILLED_SYRINGE | INTRAMUSCULAR | Status: DC | PRN
  Administered 2022-07-30: 25 ug via INTRAVENOUS

## 2022-07-30 MED ORDER — VANCOMYCIN HCL 1000 MG IV SOLR
INTRAVENOUS | Status: DC | PRN
  Administered 2022-07-30: 1000 mg via TOPICAL

## 2022-07-30 MED ORDER — STERILE WATER FOR IRRIGATION IR SOLN
Status: DC | PRN
  Administered 2022-07-30: 1000 mL

## 2022-07-30 MED ORDER — FENTANYL CITRATE PF 50 MCG/ML IJ SOSY
50.0000 ug | PREFILLED_SYRINGE | Freq: Once | INTRAMUSCULAR | Status: AC
  Administered 2022-07-30: 50 ug via INTRAVENOUS
  Filled 2022-07-30: qty 2

## 2022-07-30 MED ORDER — ONDANSETRON HCL 4 MG PO TABS: 4.0000 mg | ORAL_TABLET | Freq: Three times a day (TID) | ORAL | 0 refills | Status: DC | PRN

## 2022-07-30 MED ORDER — LACTATED RINGERS IV SOLN: INTRAVENOUS | Status: DC

## 2022-07-30 MED ORDER — NAPROXEN 500 MG PO TABS: 500.0000 mg | ORAL_TABLET | Freq: Two times a day (BID) | ORAL | 1 refills | Status: DC

## 2022-07-30 MED ORDER — ONDANSETRON HCL 4 MG/2ML IJ SOLN
INTRAMUSCULAR | Status: DC | PRN
  Administered 2022-07-30: 4 mg via INTRAVENOUS

## 2022-07-30 MED ORDER — LACTATED RINGERS IV BOLUS: 250.0000 mL | Freq: Once | INTRAVENOUS | Status: DC

## 2022-07-30 MED ORDER — OXYCODONE HCL 5 MG PO TABS: 5.0000 mg | ORAL_TABLET | Freq: Once | ORAL | Status: DC | PRN

## 2022-07-30 MED ORDER — LACTATED RINGERS IV BOLUS
500.0000 mL | Freq: Once | INTRAVENOUS | Status: AC
  Administered 2022-07-30: 500 mL via INTRAVENOUS

## 2022-07-30 MED ORDER — ROCURONIUM BROMIDE 10 MG/ML (PF) SYRINGE
PREFILLED_SYRINGE | INTRAVENOUS | Status: DC | PRN
  Administered 2022-07-30: 80 mg via INTRAVENOUS

## 2022-07-30 MED ORDER — FENTANYL CITRATE PF 50 MCG/ML IJ SOSY
PREFILLED_SYRINGE | INTRAMUSCULAR | Status: AC
  Filled 2022-07-30: qty 1

## 2022-07-30 MED ORDER — PHENYLEPHRINE HCL-NACL 20-0.9 MG/250ML-% IV SOLN
INTRAVENOUS | Status: DC | PRN
  Administered 2022-07-30: 25 ug/min via INTRAVENOUS

## 2022-07-30 MED ORDER — PHENYLEPHRINE 80 MCG/ML (10ML) SYRINGE FOR IV PUSH (FOR BLOOD PRESSURE SUPPORT)
PREFILLED_SYRINGE | INTRAVENOUS | Status: DC | PRN
  Administered 2022-07-30 (×4): 80 ug via INTRAVENOUS

## 2022-07-30 MED ORDER — TRANEXAMIC ACID-NACL 1000-0.7 MG/100ML-% IV SOLN
1000.0000 mg | INTRAVENOUS | Status: AC
  Administered 2022-07-30: 1000 mg via INTRAVENOUS
  Filled 2022-07-30: qty 100

## 2022-07-30 MED ORDER — PROPOFOL 1000 MG/100ML IV EMUL
INTRAVENOUS | Status: AC
  Filled 2022-07-30: qty 100

## 2022-07-30 MED ORDER — PROMETHAZINE HCL 25 MG/ML IJ SOLN: 6.2500 mg | INTRAMUSCULAR | Status: DC | PRN

## 2022-07-30 MED ORDER — TRAMADOL HCL 50 MG PO TABS: 50.0000 mg | ORAL_TABLET | Freq: Four times a day (QID) | ORAL | 0 refills | Status: DC | PRN

## 2022-07-30 MED ORDER — MIDAZOLAM HCL 2 MG/2ML IJ SOLN: 0.5000 mg | Freq: Once | INTRAMUSCULAR | Status: DC | PRN

## 2022-07-30 MED ORDER — DEXAMETHASONE SODIUM PHOSPHATE 10 MG/ML IJ SOLN
INTRAMUSCULAR | Status: DC | PRN
  Administered 2022-07-30: 10 mg via INTRAVENOUS

## 2022-07-30 MED ORDER — SUGAMMADEX SODIUM 200 MG/2ML IV SOLN
INTRAVENOUS | Status: DC | PRN
  Administered 2022-07-30: 320 mg via INTRAVENOUS

## 2022-07-30 MED ORDER — TIZANIDINE HCL 4 MG PO TABS: 4.0000 mg | ORAL_TABLET | Freq: Three times a day (TID) | ORAL | 1 refills | Status: DC | PRN

## 2022-07-30 SURGICAL SUPPLY — 68 items
BAG COUNTER SPONGE SURGICOUNT (BAG) IMPLANT
BAG ZIPLOCK 12X15 (MISCELLANEOUS) ×1 IMPLANT
BLADE SAW SGTL 83.5X18.5 (BLADE) ×1 IMPLANT
BNDG COHESIVE 4X5 TAN STRL LF (GAUZE/BANDAGES/DRESSINGS) ×1 IMPLANT
COOLER ICEMAN CLASSIC (MISCELLANEOUS) ×1 IMPLANT
COVER BACK TABLE 60X90IN (DRAPES) ×1 IMPLANT
COVER SURGICAL LIGHT HANDLE (MISCELLANEOUS) ×1 IMPLANT
CUP SUT UNIV REVERS 36 NEUTRAL (Cup) IMPLANT
DERMABOND ADVANCED .7 DNX12 (GAUZE/BANDAGES/DRESSINGS) ×1 IMPLANT
DRAPE INCISE IOBAN 66X45 STRL (DRAPES) IMPLANT
DRAPE ORTHO SPLIT 77X108 STRL (DRAPES) ×2
DRAPE SHEET LG 3/4 BI-LAMINATE (DRAPES) ×1 IMPLANT
DRAPE SURG 17X11 SM STRL (DRAPES) ×1 IMPLANT
DRAPE SURG ORHT 6 SPLT 77X108 (DRAPES) ×2 IMPLANT
DRAPE TOP 10253 STERILE (DRAPES) ×1 IMPLANT
DRAPE U-SHAPE 47X51 STRL (DRAPES) ×1 IMPLANT
DRESSING AQUACEL AG SP 3.5X6 (GAUZE/BANDAGES/DRESSINGS) ×1 IMPLANT
DRSG AQUACEL AG ADV 3.5X 6 (GAUZE/BANDAGES/DRESSINGS) IMPLANT
DRSG AQUACEL AG ADV 3.5X10 (GAUZE/BANDAGES/DRESSINGS) IMPLANT
DRSG AQUACEL AG SP 3.5X6 (GAUZE/BANDAGES/DRESSINGS) ×1
DRSG TEGADERM 8X12 (GAUZE/BANDAGES/DRESSINGS) ×1 IMPLANT
DURAPREP 26ML APPLICATOR (WOUND CARE) ×1 IMPLANT
ELECT BLADE TIP CTD 4 INCH (ELECTRODE) ×1 IMPLANT
ELECT PENCIL ROCKER SW 15FT (MISCELLANEOUS) ×1 IMPLANT
ELECT REM PT RETURN 15FT ADLT (MISCELLANEOUS) ×1 IMPLANT
FACESHIELD WRAPAROUND (MASK) ×4 IMPLANT
FACESHIELD WRAPAROUND OR TEAM (MASK) ×4 IMPLANT
GLENOID UNI REV MOD 24 +2 LAT (Joint) IMPLANT
GLENOSPHERE 36 +4 LAT/24 (Joint) IMPLANT
GLOVE BIO SURGEON STRL SZ7.5 (GLOVE) ×1 IMPLANT
GLOVE BIO SURGEON STRL SZ8 (GLOVE) ×1 IMPLANT
GLOVE SS BIOGEL STRL SZ 7 (GLOVE) ×1 IMPLANT
GLOVE SS BIOGEL STRL SZ 7.5 (GLOVE) ×1 IMPLANT
GOWN STRL SURGICAL XL XLNG (GOWN DISPOSABLE) ×2 IMPLANT
INSERT HUMERAL 36 +6 (Shoulder) IMPLANT
KIT BASIN OR (CUSTOM PROCEDURE TRAY) ×1 IMPLANT
KIT TURNOVER KIT A (KITS) IMPLANT
MANIFOLD NEPTUNE II (INSTRUMENTS) ×1 IMPLANT
NDL TAPERED W/ NITINOL LOOP (MISCELLANEOUS) ×1 IMPLANT
NEEDLE TAPERED W/ NITINOL LOOP (MISCELLANEOUS) ×1 IMPLANT
NS IRRIG 1000ML POUR BTL (IV SOLUTION) ×1 IMPLANT
PACK SHOULDER (CUSTOM PROCEDURE TRAY) ×1 IMPLANT
PAD ARMBOARD 7.5X6 YLW CONV (MISCELLANEOUS) ×1 IMPLANT
PAD COLD SHLDR WRAP-ON (PAD) ×1 IMPLANT
PIN NITINOL TARGETER 2.8 (PIN) IMPLANT
PIN SET MODULAR GLENOID SYSTEM (PIN) IMPLANT
RESTRAINT HEAD UNIVERSAL NS (MISCELLANEOUS) ×1 IMPLANT
SCREW CENTRAL MODULAR 25 (Screw) IMPLANT
SCREW PERI LOCK 5.5X16 (Screw) IMPLANT
SCREW PERI LOCK 5.5X32 (Screw) IMPLANT
SLING ARM FOAM STRAP LRG (SOFTGOODS) IMPLANT
SLING ARM FOAM STRAP MED (SOFTGOODS) IMPLANT
SPONGE T-LAP 4X18 ~~LOC~~+RFID (SPONGE) ×1 IMPLANT
STEM HUMERAL UNIVER REV SIZE 7 (Stem) IMPLANT
SUCTION FRAZIER HANDLE 12FR (TUBING) ×1
SUCTION TUBE FRAZIER 12FR DISP (TUBING) ×1 IMPLANT
SUT FIBERWIRE #2 38 T-5 BLUE (SUTURE)
SUT MNCRL AB 3-0 PS2 18 (SUTURE) ×1 IMPLANT
SUT MON AB 2-0 CT1 36 (SUTURE) ×1 IMPLANT
SUT VIC AB 1 CT1 36 (SUTURE) ×1 IMPLANT
SUTURE FIBERWR #2 38 T-5 BLUE (SUTURE) IMPLANT
SUTURE TAPE 1.3 40 TPR END (SUTURE) ×2 IMPLANT
SUTURETAPE 1.3 40 TPR END (SUTURE) ×2
TOWEL OR 17X26 10 PK STRL BLUE (TOWEL DISPOSABLE) ×1 IMPLANT
TOWEL OR NON WOVEN STRL DISP B (DISPOSABLE) ×1 IMPLANT
TUBE SUCTION HIGH CAP CLEAR NV (SUCTIONS) ×1 IMPLANT
WATER STERILE IRR 1000ML POUR (IV SOLUTION) ×2 IMPLANT
YANKAUER SUCT BULB TIP 10FT TU (MISCELLANEOUS) IMPLANT

## 2022-07-30 NOTE — Anesthesia Postprocedure Evaluation (Signed)
Anesthesia Post Note  Patient: April Wheeler  Procedure(s) Performed: REVERSE SHOULDER ARTHROPLASTY (Right: Shoulder)     Patient location during evaluation: Phase II Anesthesia Type: General Level of consciousness: awake and alert, patient cooperative and oriented Pain management: pain level controlled Vital Signs Assessment: post-procedure vital signs reviewed and stable Respiratory status: spontaneous breathing, nonlabored ventilation and respiratory function stable Cardiovascular status: blood pressure returned to baseline and stable Postop Assessment: no apparent nausea or vomiting, able to ambulate and adequate PO intake Anesthetic complications: no   No notable events documented.  Last Vitals:  Vitals:   07/30/22 1245 07/30/22 1300  BP: (!) 154/69 (!) 158/67  Pulse: 69 72  Resp: 19 (!) 31  Temp:    SpO2: 93% 94%    Last Pain:  Vitals:   07/30/22 1238  TempSrc:   PainSc: 3                  Sarrah Fiorenza,E. Maleena Eddleman

## 2022-07-30 NOTE — Op Note (Signed)
07/30/2022  11:55 AM  PATIENT:   April Wheeler  81 y.o. female  PRE-OPERATIVE DIAGNOSIS:  Right shoulder rotator cuff tear arthropathy  POST-OPERATIVE DIAGNOSIS: Same  PROCEDURE: Right shoulder reverse arthroplasty utilizing a press-fit size 7 Arthrex stem with a neutral metaphysis, +6 polyethylene insert, 36/+4 glenosphere on a small/+2 baseplate  SURGEON:  Jakyron Fabro, Metta Clines M.D.  ASSISTANTS: Jenetta Loges, PA-C  Jenetta Loges, PA-C was utilized as an Environmental consultant throughout this case, essential for help with positioning the patient, positioning extremity, tissue manipulation, implantation of the prosthesis, suture management, wound closure, and intraoperative decision-making.  ANESTHESIA:   General endotracheal and interscalene block with Exparel  EBL: 150 cc  SPECIMEN: None  Drains: None   PATIENT DISPOSITION:  PACU - hemodynamically stable.    PLAN OF CARE: Discharge to home after PACU  Brief history:  Patient is a 81 year old female with chronic and progressively increasing right shoulder pain related to advanced rotator cuff tear arthropathy.  Due to her increasing functional imitations and failure to respond to prolonged attempts at conservative management, she is brought to the operating this time for planned right shoulder reverse arthroplasty.  Preoperatively, I counseled the patient regarding treatment options and risks versus benefits thereof.  Possible surgical complications were all reviewed including potential for bleeding, infection, neurovascular injury, persistent pain, loss of motion, anesthetic complication, failure of the implant, and possible need for additional surgery. They understand and accept and agrees with our planned procedure.   Procedure in detail:  After undergoing routine preop evaluation the patient received prophylactic antibiotics and interscalene block with Exparel was established in the holding area by the anesthesia department.  Subsequently  placed spine on the operating table and underwent the smooth induction of a general endotracheal anesthesia.  Placed into the beachchair position and appropriate padded and protected.  The right shoulder girdle region was sterilely prepped and draped in standard fashion.  Timeout was called.  A deltopectoral approach to the right shoulder is made through an approximately 8 cm incision.  Skin flaps were elevated dissection carried deep in the deltopectoral interval was developed from proximal to distal with the vein taken laterally.  The upper centimeter the pectoralis major was tenotomized for exposure and adhesions were divided beneath the deltoid.  The long head biceps tendon was then tenodesed at the upper border the pectoralis major tendon with proximal segment unroofed and excised.  Superior remnant of the rotator cuff was split to the base of the coracoid and the subscapularis was then separated from the lesser tuberosity using electrocautery and the margin was tagged with a pair of grasping suture tape sutures.  Capsular attachments were then divided from the anterior and inferior margins of the humeral neck and the humeral head was then delivered through the wound.  An extra medullary guide was then used to outline a proposed humeral head resection which we performed at approximately 30 degrees retroversion using an oscillating saw.  A metal cap was then placed over the cut proximal humeral surface we then exposed the glenoid with appropriate retractors and performed a circumferential labral resection.  A guidepin was then directed into the center of the glenoid and glenoid was then reamed with the central followed by the peripheral reamer to a stable subchondral bony bed.  All cartilage and bone debris was then carefully removed and debrided and irrigated free.  Preparation completed with the central drill and tapped for a 25 mm lag screw.  Our baseplate was then assembled and vancomycin powder  applied to  the threads of the lag screw the baseplate was inserted with excellent purchase and fixation.  The peripheral locking screws were all then placed using standard technique with excellent fixation.  A 36/+4 glenosphere was then impacted onto the baseplate and the central locking screw was placed.  We then returned our attention back to the humeral metaphysis where the canal was opened and we broached up to a size 7 and approximate 20 degrees retroversion.  A neutral metaphyseal reaming guide was then used to prepare the metaphysis.  A trial implant was placed and reduction showed good motion good stability and good soft tissue balance.  This point the trial was then removed.  The canal was irrigated cleaned and dried with vancomycin powder applied to the canal.  Our implant was then assembled and impacted into the humeral canal with excellent fit and fixation.  A series of trial reductions were then performed and ultimately felt that the +6 poly gave is the best motion stability and soft tissue balance.  Our trial poly was removed.  The implant was cleaned and dried.  The final poly was then impacted.  Final reduction showed excellent motion neck stability and excellent soft tissue balance all much to our satisfaction.  This point we mobilized subscapularis and confirmed good elasticity.  The subscap was then repaired back to the eyelets on the collar the implant and the arm easily achieved 45 degrees of external rotation without excessive tension on the subscapularis.  The wound was then copious irrigated.  Final hemostasis was obtained.  Balance of the vancomycin powder was then spread liberally throughout the deep soft tissue planes.  The deltopectoral interval was reapproximated with a series of figure-of-eight number Vicryl sutures.  2-0 Monocryl used to close the subcu layer and intracuticular 3-0 Monocryl used to close the skin followed by Dermabond and Aquacel dressing.  Right arm placed into a sling.  The  patient was awakened, extubated, and taken to the recovery room in stable condition.  Marin Shutter MD   Contact # [phone]

## 2022-07-30 NOTE — Transfer of Care (Signed)
Immediate Anesthesia Transfer of Care Note  Patient: April Wheeler  Procedure(s) Performed: Procedure(s) with comments: REVERSE SHOULDER ARTHROPLASTY (Right) - 173min  Patient Location: PACU  Anesthesia Type:General  Level of Consciousness:  sedated, patient cooperative and responds to stimulation  Airway & Oxygen Therapy:Patient Spontanous Breathing and Patient connected to face mask oxgen  Post-op Assessment:  Report given to PACU RN and Post -op Vital signs reviewed and stable  Post vital signs:  Reviewed and stable  Last Vitals:  Vitals:   07/30/22 0920 07/30/22 1209  BP: (!) 146/61 (!) 154/66  Pulse: 82 75  Resp: 15 20  Temp:    SpO2: 14% 481%    Complications: No apparent anesthesia complications

## 2022-07-30 NOTE — Anesthesia Procedure Notes (Signed)
Anesthesia Regional Block: Interscalene brachial plexus block   Pre-Anesthetic Checklist: , timeout performed,  Correct Patient, Correct Site, Correct Laterality,  Correct Procedure, Correct Position, site marked,  Risks and benefits discussed,  Surgical consent,  Pre-op evaluation,  At surgeon's request and post-op pain management  Laterality: Right and Upper  Prep: chloraprep       Needles:  Injection technique: Single-shot  Needle Type: Echogenic Needle     Needle Length: 9cm  Needle Gauge: 21     Additional Needles:   Procedures:,,,, ultrasound used (permanent image in chart),,    Narrative:  Start time: 07/30/2022 9:07 AM End time: 07/30/2022 9:13 AM Injection made incrementally with aspirations every 5 mL.  Performed by: Personally  Anesthesiologist: Annye Asa, MD  Additional Notes: Pt identified in Holding room.  Monitors applied. Working IV access confirmed. Sterile prep R clavicle and neck.  #21ga ECHOgenic Arrow block needle to interscalene brachial plexus with US guidance.  10cc 0.5% Bupivacaine 1:200k epi, Exparel injected incrementally after negative test dose.  Patient asymptomatic, VSS, no heme aspirated, tolerated well.   Jenita Seashore, MD

## 2022-07-30 NOTE — Evaluation (Signed)
Occupational Therapy Evaluation Patient Details Name: April Wheeler MRN: [id number] DOB: 04-14-1941 Today's Date: 07/30/2022   History of Present Illness April Wheeler is s/p a R shoulder reverse arthroplasty on 07-30-2022, secondary to a rotator cuff tear arthropathy.   Clinical Impression   Pt is s/p shoulder replacement without functional use of right non-dominant upper extremity secondary to effects of surgery and interscalene block and shoulder precautions. Therapist provided education and instruction to patient and her daughter with regards to ROM/exercises, precautions, UE positioning, donning upper extremity clothing, and recommendations for bathing while maintaining shoulder precautions, use of ice for edema management and how to correctly donn/doff sling. Patient and her daughter verbalized and/or demonstrated understanding as needed. Patient needed assistance to donn shirt, underwear, pants, and shoes, with instruction provided on compensatory strategies for performing such self-care tasks. Patient to follow up with MD for further therapy needs.        Recommendations for follow up therapy are one component of a multi-disciplinary discharge planning process, led by the attending physician.  Recommendations may be updated based on patient status, additional functional criteria and insurance authorization.   Follow Up Recommendations  Follow physician's recommendations for discharge plan and follow up therapies    Assistance Recommended at Discharge Set up Supervision/Assistance  Patient can return home with the following Assist for transportation;A little help with bathing/dressing/bathroom;Assistance with cooking/housework    Functional Status Assessment  Patient has had a recent decline in their functional status and demonstrates the ability to make significant improvements in function in a reasonable and predictable amount of time.  Equipment Recommendations  None recommended by OT        Precautions / Restrictions Precautions Precautions: Shoulder Shoulder Interventions: Shoulder sling/immobilizer Precaution Booklet Issued: Yes (comment) Restrictions Weight Bearing Restrictions: Yes RUE Weight Bearing: Non weight bearing Other Position/Activity Restrictions: If sitting in controlled environment, ok to come out of sling to give neck a break. Please sleep in it to protect until follow up in office.     OK to use operative arm for feeding, hygiene and ADLs.   Ok to instruct Pendulums and lap slides as exercises. Ok to use operative arm within the following parameters for ADL purposes     New ROM (8/18)   Ok for PROM, AAROM, AROM within pain tolerance and within the following ROM   ER 20   ABD 45   FE 60      Mobility      Transfers Overall transfer level: Needs assistance   Transfers: Sit to/from Stand Sit to Stand: Supervision             Balance     Sitting balance-Leahy Scale: Good         Standing balance comment: Supervision          ADL either performed or assessed with clinical judgement     Vision Patient Visual Report: No change from baseline              Pertinent Vitals/Pain Pain Assessment Pain Assessment: No/denies pain     Hand Dominance Left   Extremity/Trunk Assessment     Lower Extremity Assessment Lower Extremity Assessment: Overall WFL for tasks assessed       Communication Communication Communication: No difficulties   Cognition Arousal/Alertness: Awake/alert Behavior During Therapy: WFL for tasks assessed/performed Overall Cognitive Status: Within Functional Limits for tasks assessed        General Comments: Oriented x4, able to follow commands without difficulty  Shoulder Instructions Shoulder Instructions Donning/doffing shirt without moving shoulder: Set-up;Supervision/safety (For patient and her daughter) Method for sponge bathing under operated UE: Patient able to independently  direct caregiver Donning/doffing sling/immobilizer: Supervision/safety (For patient and her daughter) Correct positioning of sling/immobilizer: Patient able to independently direct caregiver;Caregiver independent with task Pendulum exercises (written home exercise program): Patient able to independently direct caregiver ROM for elbow, wrist and digits of operated UE: Caregiver independent with task;Patient able to independently direct caregiver Sling wearing schedule (on at all times/off for ADL's): Caregiver independent with task;Patient able to independently direct caregiver Proper positioning of operated UE when showering: Caregiver independent with task;Patient able to independently direct caregiver Dressing change: Supervision/safety (For patient and her daughter) Positioning of UE while sleeping: Caregiver independent with task;Patient able to independently direct caregiver    Home Living Family/patient expects to be discharged to:: Private residence Living Arrangements: Spouse/significant other Available Help at Discharge: Family Type of Home: Independent living facility Home Access: [place]: One level     Bathroom Shower/Tub: [place]: Shower seat;Cane - single Barista (2 wheels)          Prior Functioning/Environment Prior Level of Function : Independent/Modified Independent               ADLs Comments: She was independent with ADLs, cooking, cleaning, driving, and ambulation.        OT Problem List: Impaired UE functional use;Decreased range of motion       AM-PAC OT "6 Clicks" Daily Activity     Outcome Measure Help from another person eating meals?: None Help from another person taking care of personal grooming?: None Help from another person toileting, which includes using toliet, bedpan, or urinal?: A Little Help from another person bathing (including washing, rinsing, drying)?: A  Little Help from another person to put on and taking off regular upper body clothing?: A Little Help from another person to put on and taking off regular lower body clothing?: A Little 6 Click Score: 20   End of Session Nurse Communication:  (Nurse cleared the pt for therapy)  Activity Tolerance: Patient tolerated treatment well Patient left: in chair;with call bell/phone within reach;with family/visitor present  OT Visit Diagnosis: Muscle weakness (generalized) (M62.81)                Time: [id number] OT Time Calculation (min): 30 min Charges:  OT General Charges $OT Visit: 1 Visit OT Evaluation $OT Eval Moderate Complexity: 1 Mod OT Treatments $Self Care/Home Management : 8-22 mins     Leota Sauers, OTR/L 07/30/2022, 4:11 PM

## 2022-07-30 NOTE — Discharge Instructions (Signed)

## 2022-07-30 NOTE — Anesthesia Procedure Notes (Signed)
Procedure Name: Intubation Date/Time: 07/30/2022 12:13 PM  Performed by: Lavina Hamman, CRNAPre-anesthesia Checklist: Patient identified, Emergency Drugs available, Suction available, Patient being monitored and Timeout performed Patient Re-evaluated:Patient Re-evaluated prior to induction Oxygen Delivery Method: Circle system utilized Preoxygenation: Pre-oxygenation with 100% oxygen Induction Type: IV induction Ventilation: Mask ventilation without difficulty Laryngoscope Size: Mac and 3 Grade View: Grade II Tube type: Oral Tube size: 7.0 mm Number of attempts: 1 Airway Equipment and Method: Stylet Placement Confirmation: ETT inserted through vocal cords under direct vision, positive ETCO2, CO2 detector and breath sounds checked- equal and bilateral Secured at: 22 cm Tube secured with: Tape Dental Injury: Teeth and Oropharynx as per pre-operative assessment

## 2022-07-30 NOTE — H&P (Signed)
April Wheeler    Chief Complaint: Right shoulder rotator cuff tear arthropathy HPI: The patient is a 81 y.o. female with chronic and progressive increasing right shoulder pain related to severe rotator cuff tear arthropathy.  Due to her increasing functional imitations and failure to respond to prolonged attempts at conservative management, she is brought to the operating room this time for planned right shoulder reverse arthroplasty  Past Medical History:  Diagnosis Date   Arthritis    Diabetes mellitus without complication ([place])    High cholesterol    Per new patient packet   History of hiatal hernia    Hypertension    Hypothyroidism       Past Surgical History:  Procedure Laterality Date   CATARACT EXTRACTION Bilateral 2016   CHOLECYSTECTOMY  1983   COLONOSCOPY  2018   Rondel Baton, MD/Per new patient packet   DG PORTABLE CHEST X RAY ([facility])  2023   Shoulder/Per new patient packet   DIAGNOSTIC MAMMOGRAM  2022   Per new patient packet   TOTAL KNEE ARTHROPLASTY Left 2017    Family History  Problem Relation Age of Onset   Other Mother        Heart   Other Father        Heart   Other Sister        Brittle diabetic   Diabetes type II Maternal Grandmother 76   Heart attack Paternal Grandmother 67   Thyroid cancer Daughter    Thyroid cancer Daughter     Social History:  reports that she has never smoked. She has never been exposed to tobacco smoke. She has never used smokeless tobacco. She reports current alcohol use. She reports that she does not use drugs.  BMI: Estimated body mass index is 30.11 kg/April as calculated from the following:   Height as of 07/24/22: 5\' 3"  (1.6 April).   Weight as of this encounter: 77.1 kg.  No results found for: "ALBUMIN" Diabetes:   Patient has a diagnosis of diabetes,  Lab Results  Component Value Date   HGBA1C 6.2 (H) 07/24/2022   Smoking Status:   reports that she has never smoked. She has never been exposed to tobacco  smoke. She has never used smokeless tobacco.     Medications Prior to Admission  Medication Sig Dispense Refill   acetaminophen (TYLENOL) 500 MG tablet Take 1,000 mg by mouth in the morning, at noon, in the evening, and at bedtime.     Calcium Carbonate-Vitamin D 600-5 MG-MCG TABS Take 1 tablet by mouth daily.     colestipol (COLESTID) 1 g tablet TAKE 1 TABLET BY MOUTH DAILY. 30 tablet 5   famotidine (PEPCID AC MAXIMUM STRENGTH) 20 MG tablet Take 20 mg by mouth daily.     Ferrous Sulfate (IRON) 325 (65 Fe) MG TABS Take 325 mg by mouth 3 (three) times a week. Monday, Wednesday, Friday     fluticasone (FLONASE) 50 MCG/ACT nasal spray Place 1 spray into both nostrils daily as needed for allergies.     levothyroxine (SYNTHROID) 100 MCG tablet Take 1 tablet (100 mcg total) by mouth daily. 90 tablet 1   losartan-hydrochlorothiazide (HYZAAR) 100-25 MG tablet Take 1 tablet by mouth every morning.     Menthol, Topical Analgesic, (BIOFREEZE) 10 % CREA Apply 1 Application topically at bedtime.     metFORMIN (GLUCOPHAGE) 850 MG tablet Take 1 tablet (850 mg total) by mouth 3 (three) times daily. 270 tablet 1   oxybutynin (DITROPAN-XL)  5 MG 24 hr tablet Take 5 mg by mouth at bedtime.     rosuvastatin (CRESTOR) 20 MG tablet Take 20 mg by mouth at bedtime.     sitaGLIPtin (JANUVIA) 100 MG tablet Take 1 tablet (100 mg total) by mouth daily. 90 tablet 3     Physical Exam: Right shoulder demonstrates painful and guarded motion as noted at recent office visits.  Globally decreased strength to manual muscle testing.  Otherwise neurovascular intact in the right upper extremity.  Plain radiographs confirm high riding humeral head.  Recent MRI scan confirms a massive chronically retracted tear of the entire superior rotator cuff with marked atrophy and fatty infiltration of the rotator cuff musculature.  Vitals  Temp:  [98.6 F (37 C)] 98.6 F (37 C) (10/05 0820) Pulse Rate:  [67-82] 82 (10/05 0920) Resp:   [10-18] 15 (10/05 0920) BP: (142-178)/(61-76) 146/61 (10/05 0920) SpO2:  [97 %-100 %] 97 % (10/05 0920) Weight:  [77.1 kg] 77.1 kg (10/05 0846)  Assessment/Plan  Impression: Right shoulder rotator cuff tear arthropathy  Plan of Action: Procedure(s): REVERSE SHOULDER ARTHROPLASTY  April Wheeler April Wheeler 07/30/2022, 9:41 AM Contact # [phone]

## 2022-08-03 ENCOUNTER — Encounter (HOSPITAL_COMMUNITY): Payer: Self-pay | Admitting: Orthopedic Surgery

## 2022-09-02 ENCOUNTER — Encounter: Payer: Self-pay | Admitting: Internal Medicine

## 2022-09-02 ENCOUNTER — Non-Acute Institutional Stay: Payer: MEDICARE | Admitting: Internal Medicine

## 2022-09-02 VITALS — BP 164/78 | HR 84 | Temp 97.9°F | Resp 18 | Ht 63.0 in | Wt 171.6 lb

## 2022-09-02 DIAGNOSIS — Z96611 Presence of right artificial shoulder joint: Secondary | ICD-10-CM

## 2022-09-02 DIAGNOSIS — F5101 Primary insomnia: Secondary | ICD-10-CM

## 2022-09-02 DIAGNOSIS — E785 Hyperlipidemia, unspecified: Secondary | ICD-10-CM

## 2022-09-02 DIAGNOSIS — E1169 Type 2 diabetes mellitus with other specified complication: Secondary | ICD-10-CM

## 2022-09-02 DIAGNOSIS — E039 Hypothyroidism, unspecified: Secondary | ICD-10-CM | POA: Diagnosis not present

## 2022-09-02 MED ORDER — TRAZODONE HCL 50 MG PO TABS: 25.0000 mg | ORAL_TABLET | Freq: Every evening | ORAL | 3 refills | Status: DC | PRN

## 2022-09-02 NOTE — Progress Notes (Signed)
[facility] Service:  Clinic (12)  Provider:   Code Status:  Goals of Care:     09/02/2022    1:18 PM  Advanced Directives  Does Patient Have a Medical Advance Directive? Yes  Type of Estate agent of [place];Living will  Does patient want to make changes to medical advance directive? No - Patient declined  Copy of Healthcare Power of Attorney in Chart? Yes - validated most recent copy scanned in chart (See row information)     Chief Complaint  Patient presents with   Medical Management of Chronic Issues    6 month follow up.   Immunizations    Discussed the need for shingles and pneumonia vaccines. She declined    Quality Metric Gaps    Discuss care gaps     HPI: Patient is a 81 y.o. Wheeler seen today for an acute visit for insomnia   Lives in [place]  Has history of type 2 diabetes, iron deficiency anemia, hypothyroidism, chronic arthritis, hyperlipidemia Also has history of bilateral shoulder pain  Now s/p Right Shoulder Arthroplasty on 10/05  Doing well Off all her Pain meds except Tylenol Working with therapy Daughters helped with her post op Walking well Wanted something for insomnia Using Muscle relaxant but makes her very sleepy and feels groggy when gets up    Past Medical History:  Diagnosis Date   Arthritis    Diabetes mellitus without complication (H[facility])    High cholesterol    Per new patient packet   History of hiatal hernia    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   Hypertension    Hypothyroidism     Past Surgical History:  Procedure Laterality Date   CATARACT EXTRACTION Bilateral 2016   CHOLECYSTECTOMY  1983   COLONOSCOPY  2018   Octavia Bruckner, MD/Per new patient packet   DG PORTABLE CHEST X RAY ([facility] HX)  2023   Shoulder/Per new patient packet   DIAGNOSTIC MAMMOGRAM  2022   Per new patient packet   REVERSE SHOULDER ARTHROPLASTY Right 07/30/2022    Procedure: REVERSE SHOULDER ARTHROPLASTY;  Surgeon: Francena Hanly, MD;  Location: [facility];  Service: Orthopedics;  Laterality: Right;    TOTAL KNEE ARTHROPLASTY Left 2017   TOTAL SHOULDER ARTHROPLASTY  07/30/2022    Allergies  Allergen Reactions   Celecoxib Diarrhea    Other reaction(s): other   Quinidine Diarrhea   Codeine Rash    Outpatient Encounter Medications as of 09/02/2022  Medication Sig   acetaminophen (TYLENOL) 500 MG tablet Take 1,000 mg by mouth in the morning, at noon, in the evening, and at bedtime.   Calcium Carbonate-Vitamin D 600-5 MG-MCG TABS Take 1 tablet by mouth daily.   colestipol (COLESTID) 1 g tablet TAKE 1 TABLET BY MOUTH DAILY.   famotidine (PEPCID AC MAXIMUM STRENGTH) 20 MG tablet Take 20 mg by mouth daily.   Ferrous Sulfate (IRON) 325 (65 Fe) MG TABS Take 325 mg by mouth 3 (three) times a week. Monday, Wednesday, Friday   fluticasone (FLONASE) 50 MCG/ACT nasal spray Place 1 spray into both nostrils daily as needed for allergies.   levothyroxine (SYNTHROID) 100 MCG tablet Take 1 tablet (100 mcg total) by mouth daily.   losartan-hydrochlorothiazide (HYZAAR) 100-25 MG tablet Take 1 tablet by mouth every morning.   Menthol, Topical Analgesic, (BIOFREEZE) 10 % CREA Apply 1 Application topically at bedtime.   metFORMIN (GLUCOPHAGE) 850 MG tablet Take  1 tablet (850 mg total) by mouth 3 (three) times daily.   ondansetron (ZOFRAN) 4 MG tablet Take 1 tablet (4 mg total) by mouth every 8 (eight) hours as needed for nausea or vomiting.   oxybutynin (DITROPAN-XL) 5 MG 24 hr tablet Take 5 mg by mouth at bedtime.   rosuvastatin (CRESTOR) 20 MG tablet Take 20 mg by mouth at bedtime.   sitaGLIPtin (JANUVIA) 100 MG tablet Take 1 tablet (100 mg total) by mouth daily.   tiZANidine (ZANAFLEX) 4 MG tablet Take 1 tablet (4 mg total) by mouth every 8 (eight) hours as needed for muscle spasms.   traZODone (DESYREL) 50 MG tablet Take 0.5-1 tablets (25-50 mg total) by mouth at  bedtime as needed for sleep. Start with half tablet at night. If does not help can take full tablet in few days   [DISCONTINUED] naproxen (NAPROSYN) 500 MG tablet Take 1 tablet (500 mg total) by mouth 2 (two) times daily with a meal.   [DISCONTINUED] oxyCODONE-acetaminophen (PERCOCET) 5-325 MG tablet Take 1 tablet by mouth every 4 (four) hours as needed for severe pain (max 6 q).   [DISCONTINUED] traMADol (ULTRAM) 50 MG tablet Take 1 tablet (50 mg total) by mouth every 6 (six) hours as needed for moderate pain.   No facility-administered encounter medications on file as of 09/02/2022.    Review of Systems:  Review of Systems  Constitutional:  Negative for activity change and appetite change.  HENT: Negative.    Respiratory:  Negative for cough and shortness of breath.   Cardiovascular:  Negative for leg swelling.  Gastrointestinal:  Positive for constipation.  Genitourinary: Negative.   Musculoskeletal:  Positive for arthralgias. Negative for gait problem and myalgias.  Skin: Negative.   Neurological:  Negative for dizziness and weakness.  Psychiatric/Behavioral:  Positive for sleep disturbance. Negative for confusion and dysphoric mood.     Health Maintenance  Topic Date Due   OPHTHALMOLOGY EXAM  Never done   Diabetic kidney evaluation - Urine ACR  Never done   Hepatitis C Screening  Never done   Zoster Vaccines- Shingrix (1 of 2) Never done   Pneumonia Vaccine 54+ Years old (1 - PCV) Never done   DEXA SCAN  Never done   Medicare Annual Wellness (AWV)  05/22/2022   HEMOGLOBIN A1C  01/22/2023   FOOT EXAM  02/11/2023   Diabetic kidney evaluation - GFR measurement  07/25/2023   TETANUS/TDAP  05/23/2031   INFLUENZA VACCINE  Completed   COVID-19 Vaccine  Completed   HPV VACCINES  Aged Out    Physical Exam: Vitals:   09/02/22 1311  BP: (!) 164/78  Pulse: 84  Resp: 18  Temp: 97.9 F (36.6 C)  TempSrc: Temporal  SpO2: 97%  Weight: 171 lb 9.6 oz (April.8 kg)  Height: 5\' 3"  (1.6  m)   Body mass index is 30.4 kg/m. Physical Exam Vitals reviewed.  Constitutional:      Appearance: Normal appearance.  HENT:     Head: Normocephalic.     Nose: Nose normal.     Mouth/Throat:     Mouth: Mucous membranes are moist.     Pharynx: Oropharynx is clear.  Eyes:     Pupils: Pupils are equal, round, and reactive to light.  Cardiovascular:     Rate and Rhythm: Normal rate and regular rhythm.     Pulses: Normal pulses.     Heart sounds: Normal heart sounds. No murmur heard. Pulmonary:     Effort: Pulmonary effort  is normal.     Breath sounds: Normal breath sounds.  Abdominal:     General: Abdomen is flat. Bowel sounds are normal.     Palpations: Abdomen is soft.  Musculoskeletal:        General: No swelling.     Cervical back: Neck supple.     Comments: Limited Mobility in Right Shoulder and Left shoulder  Skin:    General: Skin is warm.  Neurological:     General: No focal deficit present.     Mental Status: She is alert and oriented to person, place, and time.  Psychiatric:        Mood and Affect: Mood normal.        Thought Content: Thought content normal.     Labs reviewed: Basic Metabolic Panel: Recent Labs    03/12/22 0830 05/07/22 0840 07/24/22 0924  NA 139  --  138  K 3.8  --  3.6  CL 102  --  105  CO2 29  --  26  GLUCOSE 112*  --  138*  BUN 16  --  15  CREATININE 0.80  --  0.80  CALCIUM 9.8  --  9.8  TSH 0.23* 0.43  --    Liver Function Tests: Recent Labs    03/12/22 0830  AST 20  ALT 18  BILITOT 0.4  PROT 6.7   No results for input(s): "LIPASE", "AMYLASE" in the last 8760 hours. No results for input(s): "AMMONIA" in the last 8760 hours. CBC: Recent Labs    03/12/22 0830 07/24/22 0924  WBC 5.0 7.3  NEUTROABS 2,255  --   HGB 12.2 12.1  HCT 37.4 37.8  MCV 79.7* 82.2  PLT 299 329   Lipid Panel: Recent Labs    03/12/22 0830  CHOL 101  HDL 40*  LDLCALC 42  TRIG 222  CHOLHDL 2.5   Lab Results  Component Value Date    HGBA1C 6.2 (H) 07/24/2022    Procedures since last visit: No results found.  Assessment/Plan 1. Primary insomnia Trazodone start with 25 mg and then increase to 50 mg  2. History of arthroplasty of right shoulder Pain Control with Tylenol Want to stop her Muscle relaxant also Working with therapy  3. Acquired hypothyroidism TSH normal in 7/23  4. Type 2 diabetes mellitus with other specified complication, without long-term current use of insulin (HCC) A!C good control Sometimes misses her afternoon dose of Metformin which I told her it is ok  5. Hyperlipidemia, unspecified hyperlipidemia type On statin    Labs/tests ordered:  * No order type specified * Next appt:  09/21/2022

## 2022-09-21 ENCOUNTER — Encounter: Payer: Self-pay | Admitting: Orthopedic Surgery

## 2022-09-21 ENCOUNTER — Ambulatory Visit: Payer: MEDICARE | Admitting: Orthopedic Surgery

## 2022-09-21 VITALS — BP 138/80 | HR 82 | Temp 95.7°F | Resp 18 | Ht 64.0 in | Wt 175.2 lb

## 2022-09-21 DIAGNOSIS — Z0000 Encounter for general adult medical examination without abnormal findings: Secondary | ICD-10-CM

## 2022-09-21 DIAGNOSIS — E039 Hypothyroidism, unspecified: Secondary | ICD-10-CM

## 2022-09-21 DIAGNOSIS — E1169 Type 2 diabetes mellitus with other specified complication: Secondary | ICD-10-CM | POA: Diagnosis not present

## 2022-09-21 MED ORDER — METFORMIN HCL 850 MG PO TABS: 850.0000 mg | ORAL_TABLET | Freq: Three times a day (TID) | ORAL | 1 refills | Status: DC

## 2022-09-21 MED ORDER — LEVOTHYROXINE SODIUM 100 MCG PO TABS: 100.0000 ug | ORAL_TABLET | Freq: Every day | ORAL | 1 refills | Status: DC

## 2022-09-21 MED ORDER — SITAGLIPTIN PHOSPHATE 100 MG PO TABS: 100.0000 mg | ORAL_TABLET | Freq: Every day | ORAL | 3 refills | Status: DC

## 2022-09-21 NOTE — Progress Notes (Signed)
Subjective:   April Wheeler is a 81 y.o. female who presents for Medicare Annual (Subsequent) preventive examination.  Place of Service: [facility] clinic Provider: Hazle Nordmann, AGNP-C   Review of Systems     Cardiac Risk Factors include: advanced age (>56men, >26 women);diabetes mellitus     Objective:    Today's Vitals   09/21/22 1515  BP: 138/80  Pulse: 82  Resp: 18  Temp: (!) 95.7 F (35.4 C)  SpO2: 96%  Weight: 175 lb 4 oz (79.5 kg)  Height: 5\' 4"  (1.626 m)   Body mass index is 30.08 kg/m.     09/02/2022    1:18 PM 07/24/2022    9:09 AM 02/03/2022   12:06 PM  Advanced Directives  Does Patient Have a Medical Advance Directive? Yes Yes No  Type of 04/05/2022 of [place];Living will Healthcare Power of [place];Living will   Does patient want to make changes to medical advance directive? No - Patient declined    Copy of Healthcare Power of Attorney in Chart? Yes - validated most recent copy scanned in chart (See row information)    Would patient like information on creating a medical advance directive?   No - Patient declined    Current Medications (verified) Outpatient Encounter Medications as of 09/21/2022  Medication Sig   acetaminophen (TYLENOL) 500 MG tablet Take 1,000 mg by mouth in the morning, at noon, in the evening, and at bedtime.   Calcium Carbonate-Vitamin D 600-5 MG-MCG TABS Take 1 tablet by mouth daily.   colestipol (COLESTID) 1 g tablet TAKE 1 TABLET BY MOUTH DAILY.   famotidine (PEPCID AC MAXIMUM STRENGTH) 20 MG tablet Take 20 mg by mouth daily.   fluticasone (FLONASE) 50 MCG/ACT nasal spray Place 1 spray into both nostrils daily as needed for allergies.   levothyroxine (SYNTHROID) 100 MCG tablet Take 1 tablet (100 mcg total) by mouth daily.   losartan-hydrochlorothiazide (HYZAAR) 100-25 MG tablet Take 1 tablet by mouth every morning.   Menthol, Topical Analgesic, (BIOFREEZE) 10 % CREA Apply 1 Application topically at  bedtime.   metFORMIN (GLUCOPHAGE) 850 MG tablet Take 1 tablet (850 mg total) by mouth 3 (three) times daily.   oxybutynin (DITROPAN-XL) 5 MG 24 hr tablet Take 5 mg by mouth at bedtime.   rosuvastatin (CRESTOR) 20 MG tablet Take 20 mg by mouth at bedtime.   sitaGLIPtin (JANUVIA) 100 MG tablet Take 1 tablet (100 mg total) by mouth daily.   [DISCONTINUED] Ferrous Sulfate (IRON) 325 (65 Fe) MG TABS Take 325 mg by mouth 3 (three) times a week. Monday, Wednesday, Friday   [DISCONTINUED] ondansetron (ZOFRAN) 4 MG tablet Take 1 tablet (4 mg total) by mouth every 8 (eight) hours as needed for nausea or vomiting.   [DISCONTINUED] tiZANidine (ZANAFLEX) 4 MG tablet Take 1 tablet (4 mg total) by mouth every 8 (eight) hours as needed for muscle spasms.   [DISCONTINUED] traZODone (DESYREL) 50 MG tablet Take 0.5-1 tablets (25-50 mg total) by mouth at bedtime as needed for sleep. Start with half tablet at night. If does not help can take full tablet in few days   No facility-administered encounter medications on file as of 09/21/2022.    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   Hypertension    Hypothyroidism    Past Surgical History:  Procedure Laterality Date   CATARACT EXTRACTION Bilateral 2016   CHOLECYSTECTOMY  1983   COLONOSCOPY  2018   Octavia Bruckner, MD/Per new patient packet   DG PORTABLE CHEST X RAY ([facility] HX)  2023   Shoulder/Per new patient packet   DIAGNOSTIC MAMMOGRAM  2022   Per new patient packet   REVERSE SHOULDER ARTHROPLASTY Right 07/30/2022   Procedure: REVERSE SHOULDER ARTHROPLASTY;  Surgeon: Francena Hanly, MD;  Location: [facility];  Service: Orthopedics;  Laterality: Right;    TOTAL KNEE ARTHROPLASTY Left 2017   TOTAL  SHOULDER ARTHROPLASTY  07/30/2022   Family History  Problem Relation Age of Onset   Other Mother        Heart   Other Father        Heart   Other Sister        Brittle diabetic   Diabetes type II Maternal Grandmother 23   Heart attack Paternal Grandmother 75   Thyroid cancer Daughter    Thyroid cancer Daughter    Social History   Socioeconomic History   Marital status: Married    Spouse name: Not on file   Number of children: Not on file   Years of education: Not on file   Highest education level: Not on file  Occupational History   Not on file  Tobacco Use   Smoking status: Never    Passive exposure: Never   Smokeless tobacco: Never  Vaping Use   Vaping Use: Never used  Substance and Sexual Activity   Alcohol use: Yes    Comment: Social   Drug use: Never   Sexual activity: Not Currently  Other Topics Concern   Not on file  Social History Narrative   Diet: Left blank      Caffeine: Yes      Married, if yes what year: Married, in 1964      Do you live in a house, apartment, assisted living, condo, trailer, ect: Apartment      Is it one or more stories: 3      How many persons live in your home? 2      Pets: No      Highest level or education completed: High school      Current/Past profession: Left blank      Exercise:  Yes                Type and how often: Walk         Living Will: Yes   DNR: No and if not, do you wish to discuss one? No   POA/HPOA: Yes      Functional Status:   Do you have difficulty bathing or dressing yourself? No   Do you have difficulty preparing food or eating? No   Do you have difficulty managing your medications? No   Do you have difficulty managing your finances? No   Do you have difficulty affording your medications? No   Social Determinants of Health   Financial Resource Strain: Low Risk  (09/21/2022)   Overall Financial Resource Strain (CARDIA)    Difficulty of Paying Living Expenses: Not hard at all  Food  Insecurity: No Food Insecurity (09/21/2022)   Hunger Vital Sign    Worried About Running Out of Food in the Last Year: Never true    Ran Out of Food in the Last Year:  Never true  Transportation Needs: No Transportation Needs (09/21/2022)   PRAPARE - Administrator, Civil Service (Medical): No    Lack of Transportation (Non-Medical): No  Physical Activity: Sufficiently Active (09/21/2022)   Exercise Vital Sign    Days of Exercise per Week: 7 days    Minutes of Exercise per Session: 30 min  Stress: No Stress Concern Present (09/21/2022)   [employer] of Occupational Health - Occupational Stress Questionnaire    Feeling of Stress : Only a little  Social Connections: Moderately Integrated (09/21/2022)   Social Connection and Isolation Panel [NHANES]    Frequency of Communication with Friends and Family: More than three times a week    Frequency of Social Gatherings with Friends and Family: More than three times a week    Attends Religious Services: More than 4 times per year    Active Member of [employer] or Organizations: No    Attends Engineer, structural: Never    Marital Status: Married    Tobacco Counseling Counseling given: Not Answered   Clinical Intake:  Pre-visit preparation completed: Yes  Pain : No/denies pain     BMI - recorded: 30.08 Nutritional Status: BMI > 30  Obese Nutritional Risks: Nausea/ vomitting/ diarrhea Diabetes: Yes CBG done?: No Did pt. bring in CBG monitor from home?: No  How often do you need to have someone help you when you read instructions, pamphlets, or other written materials from your doctor or pharmacy?: 1 - Never What is the last grade level you completed in school?: High school  Diabetic?Yes  Interpreter Needed?: No      Activities of Daily Living    09/21/2022    3:34 PM 07/24/2022    9:12 AM  In your present state of health, do you have any difficulty performing the following activities:  Hearing? 0    Vision? 0   Difficulty concentrating or making decisions? 0   Walking or climbing stairs? 0   Dressing or bathing? 0   Doing errands, shopping? 0 0  Preparing Food and eating ? N   Using the Toilet? N   In the past six months, have you accidently leaked urine? N   Do you have problems with loss of bowel control? Y   Managing your Medications? N   Managing your Finances? N   Housekeeping or managing your Housekeeping? N     Patient Care Team: Mahlon Gammon, MD as PCP - General (Internal Medicine) Lindell Spar, MD (Orthopedic Surgery) Helane Gunther, DPM as Consulting Physician (Podiatry)  Indicate any recent Medical Services you may have received from other than [facility] providers in the past year (date may be approximate).     Assessment:   This is a routine wellness examination for [place].  Hearing/Vision screen Hearing Screening - Comments:: Hearing is okay Vision Screening - Comments:: Pt wear contact lens   Dietary issues and exercise activities discussed: Exercise limited by: None identified   Goals Addressed             This Visit's Progress    DIET - INCREASE WATER INTAKE   On track    Maintain Mobility and Function   Not on track    Evidence-based guidance:  Acknowledge and validate impact of pain, loss of strength and potential disfigurement (hand osteoarthritis) on mental health and daily life, such as social isolation, anxiety, depression, impaired sexual relationship and   injury from falls.  Anticipate referral to physical or occupational  therapy for assessment, therapeutic exercise and recommendation for adaptive equipment or assistive devices; encourage participation.  Assess impact on ability to perform activities of daily living, as well as engage in sports and leisure events or requirements of work or school.  Provide anticipatory guidance and reassurance about the benefit of exercise to maintain function; acknowledge and normalize fear that exercise  may worsen symptoms.  Encourage regular exercise, at least 10 minutes at a time for 45 minutes per week; consider yoga, water exercise and proprioceptive exercises; encourage use of wearable activity tracker to increase motivation and adherence.  Encourage maintenance or resumption of daily activities, including employment, as pain allows and with minimal exposure to trauma.  Assist patient to advocate for adaptations to the work environment.  Consider level of pain and function, gender, age, lifestyle, patient preference, quality of life, readiness and ?ocapacity to benefit? when recommending patients for orthopaedic surgery consultation.  Explore strategies, such as changes to medication regimen or activity that enables patient to anticipate and manage flare-ups that increase deconditioning and disability.  Explore patient preferences; encourage exposure to a broader range of activities that have been avoided for fear of experiencing pain.  Identify barriers to participation in therapy or exercise, such as pain with activity, anticipated or imagined pain.  Monitor postoperative joint replacement or any preexisting joint replacement for ongoing pain and loss of function; provide social support and encouragement throughout recovery.   Notes:      Weight (lb) < 200 lb (90.7 kg)   175 lb 4 oz (79.5 kg)      Depression Screen    09/21/2022    2:59 PM  PHQ 2/9 Scores  PHQ - 2 Score 0    Fall Risk    09/21/2022    3:34 PM 09/21/2022    2:59 PM 09/02/2022    1:18 PM  Fall Risk   Falls in the past year? 0 0 1  Number falls in past yr: 0 0 0  Injury with Fall? 0 0 1  Risk for fall due to : History of fall(s);Impaired balance/gait History of fall(s) History of fall(s)  Follow up Falls evaluation completed;Education provided Falls evaluation completed Falls evaluation completed    FALL RISK PREVENTION PERTAINING TO THE HOME:  Any stairs in or around the home? No  If so, are there any  without handrails? Yes  Home free of loose throw rugs in walkways, pet beds, electrical cords, etc? Yes  Adequate lighting in your home to reduce risk of falls? Yes   ASSISTIVE DEVICES UTILIZED TO PREVENT FALLS:  Life alert? No  Use of a cane, walker or w/c? No  Grab bars in the bathroom? Yes  Shower chair or bench in shower? Yes  Elevated toilet seat or a handicapped toilet? Yes   TIMED UP AND GO:  Was the test performed? No .  Length of time to ambulate 10 feet: N/A sec.   Gait slow and steady without use of assistive device  Cognitive Function:    09/21/2022    3:11 PM  MMSE - Mini Mental State Exam  Orientation to time 5  Orientation to Place 5  Registration 3  Attention/ Calculation 5  Recall 3  Language- name 2 objects 2  Language- repeat 1  Language- follow 3 step command 3  Language- read & follow direction 1  Write a sentence 1  Copy design 1  Total score 30        Immunizations Immunization History  Administered  Date(s) Administered   Fluad Quad(high Dose 65+) 08/06/2017, 08/29/2018, 07/11/2019, 07/28/2021, 08/12/2022   Influenza, High Dose Seasonal PF 07/30/2020, 07/26/2021   PFIZER(Purple Top)SARS-COV-2 Vaccination 11/22/2019, 12/13/2019, 07/23/2020   Pfizer Covid-19 Vaccine Bivalent Booster 7828yrs & up 03/02/2021, 01/01/2022   Tdap 08/25/2010, 05/22/2021   Zoster, Live 01/25/2007    TDAP status: Up to date  Flu Vaccine status: Up to date  Pneumococcal vaccine status: Due, Education has been provided regarding the importance of this vaccine. Advised may receive this vaccine at local pharmacy or Health Dept. Aware to provide a copy of the vaccination record if obtained from local pharmacy or Health Dept. Verbalized acceptance and understanding.  Covid-19 vaccine status: Completed vaccines  Qualifies for Shingles Vaccine? Yes   Zostavax completed No   Shingrix Completed?: No.    Education has been provided regarding the importance of this vaccine.  Patient has been advised to call insurance company to determine out of pocket expense if they have not yet received this vaccine. Advised may also receive vaccine at local pharmacy or Health Dept. Verbalized acceptance and understanding.  Screening Tests Health Maintenance  Topic Date Due   OPHTHALMOLOGY EXAM  Never done   Diabetic kidney evaluation - Urine ACR  Never done   Hepatitis C Screening  Never done   Zoster Vaccines- Shingrix (1 of 2) Never done   Pneumonia Vaccine 165+ Years old (1 - PCV) Never done   DEXA SCAN  Never done   COVID-19 Vaccine (6 - 2023-24 season) 06/26/2022   HEMOGLOBIN A1C  01/22/2023   FOOT EXAM  02/11/2023   Diabetic kidney evaluation - GFR measurement  07/25/2023   Medicare Annual Wellness (AWV)  09/22/2023   INFLUENZA VACCINE  Completed   HPV VACCINES  Aged Out    Health Maintenance  Health Maintenance Due  Topic Date Due   OPHTHALMOLOGY EXAM  Never done   Diabetic kidney evaluation - Urine ACR  Never done   Hepatitis C Screening  Never done   Zoster Vaccines- Shingrix (1 of 2) Never done   Pneumonia Vaccine 3965+ Years old (1 - PCV) Never done   DEXA SCAN  Never done   COVID-19 Vaccine (6 - 2023-24 season) 06/26/2022    Colorectal cancer screening: No longer required.   Mammogram status: Completed 2022. Repeat every year  Bone Density status: Completed more than 5 years ago- would like after next shoulder replacement. Results reflect: Bone density results: NORMAL. Repeat every 3-5 years.  Lung Cancer Screening: (Low Dose CT Chest recommended if Age 93-80 years, 30 pack-year currently smoking OR have quit w/in 15years.) does not qualify.   Lung Cancer Screening Referral: No  Additional Screening:  Hepatitis C Screening: does not qualify; Completed   Vision Screening: Recommended annual ophthalmology exams for early detection of glaucoma and other disorders of the eye. Is the patient up to date with their annual eye exam?  Yes  Who is the  provider or what is the name of the office in which the patient attends annual eye exams? Planning to schedule If pt is not established with a provider, would they like to be referred to a provider to establish care? No .   Dental Screening: Recommended annual dental exams for proper oral hygiene  Community Resource Referral / Chronic Care Management: CRR required this visit?  No   CCM required this visit?  No      Plan:     I have personally reviewed and noted the following in the  patient's chart:   Medical and social history Use of alcohol, tobacco or illicit drugs  Current medications and supplements including opioid prescriptions. Patient is not currently taking opioid prescriptions. Functional ability and status Nutritional status Physical activity Advanced directives List of other physicians Hospitalizations, surgeries, and ER visits in previous 12 months Vitals Screenings to include cognitive, depression, and falls Referrals and appointments  In addition, I have reviewed and discussed with patient certain preventive protocols, quality metrics, and best practice recommendations. A written personalized care plan for preventive services as well as general preventive health recommendations were provided to patient.     Octavia Heir, NP   09/21/2022   Nurse Notes: recommend Prevnar 20 at local pharmacy, will discuss DEXA and mammogram after second shoulder surgery

## 2022-09-21 NOTE — Patient Instructions (Addendum)
  [name] , Thank you for taking time to come for your Medicare Wellness Visit. I appreciate your ongoing commitment to your health goals. Please review the following plan we discussed and let me know if I can assist you in the future.   These are the goals we discussed:  Goals      DIET - INCREASE WATER INTAKE     Maintain Mobility and Function     Evidence-based guidance:  Acknowledge and validate impact of pain, loss of strength and potential disfigurement (hand osteoarthritis) on mental health and daily life, such as social isolation, anxiety, depression, impaired sexual relationship and   injury from falls.  Anticipate referral to physical or occupational therapy for assessment, therapeutic exercise and recommendation for adaptive equipment or assistive devices; encourage participation.  Assess impact on ability to perform activities of daily living, as well as engage in sports and leisure events or requirements of work or school.  Provide anticipatory guidance and reassurance about the benefit of exercise to maintain function; acknowledge and normalize fear that exercise may worsen symptoms.  Encourage regular exercise, at least 10 minutes at a time for 45 minutes per week; consider yoga, water exercise and proprioceptive exercises; encourage use of wearable activity tracker to increase motivation and adherence.  Encourage maintenance or resumption of daily activities, including employment, as pain allows and with minimal exposure to trauma.  Assist patient to advocate for adaptations to the work environment.  Consider level of pain and function, gender, age, lifestyle, patient preference, quality of life, readiness and ?ocapacity to benefit? when recommending patients for orthopaedic surgery consultation.  Explore strategies, such as changes to medication regimen or activity that enables patient to anticipate and manage flare-ups that increase deconditioning and disability.  Explore  patient preferences; encourage exposure to a broader range of activities that have been avoided for fear of experiencing pain.  Identify barriers to participation in therapy or exercise, such as pain with activity, anticipated or imagined pain.  Monitor postoperative joint replacement or any preexisting joint replacement for ongoing pain and loss of function; provide social support and encouragement throughout recovery.   Notes:      Weight (lb) < 200 lb (90.7 kg)        This is a list of the screening recommended for you and due dates:  Health Maintenance  Topic Date Due   Eye exam for diabetics  Never done   Yearly kidney health urinalysis for diabetes  Never done   Hepatitis C Screening: USPSTF Recommendation to screen - Ages 76-79 yo.  Never done   Pneumonia Vaccine (1 - PCV) Never done   DEXA scan (bone density measurement)  Never done   Zoster (Shingles) Vaccine (1 of 2) 12/22/2022*   COVID-19 Vaccine (7 - 2023-24 season) 10/11/2022   Hemoglobin A1C  01/22/2023   Complete foot exam   02/11/2023   Yearly kidney function blood test for diabetes  07/25/2023   Medicare Annual Wellness Visit  09/22/2023   Flu Shot  Completed   HPV Vaccine  Aged Out  *Topic was postponed. The date shown is not the original due date.   Recommend Prevnar 20 (pneumonia vaccine)- please get at local pharmacy  Eye specialists: Dr. Dione Booze Dr. Mccuen/ Dr. Nile Riggs Dr. Sherryll Burger

## 2022-10-14 ENCOUNTER — Ambulatory Visit: Payer: MEDICARE | Admitting: Podiatry

## 2022-10-29 ENCOUNTER — Other Ambulatory Visit: Payer: MEDICARE

## 2022-10-29 DIAGNOSIS — E785 Hyperlipidemia, unspecified: Secondary | ICD-10-CM

## 2022-10-29 DIAGNOSIS — E039 Hypothyroidism, unspecified: Secondary | ICD-10-CM

## 2022-10-29 DIAGNOSIS — E1169 Type 2 diabetes mellitus with other specified complication: Secondary | ICD-10-CM

## 2022-10-29 LAB — CBC WITH DIFFERENTIAL/PLATELET
Absolute Monocytes: 525 cells/uL (ref 200–950)
Basophils Absolute: 79 cells/uL (ref 0–200)
Basophils Relative: 1.3 %
Eosinophils Absolute: 214 cells/uL (ref 15–500)
Eosinophils Relative: 3.5 %
HCT: 34.5 % — ABNORMAL LOW (ref 35.0–45.0)
Hemoglobin: 11 g/dL — ABNORMAL LOW (ref 11.7–15.5)
Lymphs Abs: 1854 cells/uL (ref 850–3900)
MCH: 25.3 pg — ABNORMAL LOW (ref 27.0–33.0)
MCHC: 31.9 g/dL — ABNORMAL LOW (ref 32.0–36.0)
MCV: 79.3 fL — ABNORMAL LOW (ref 80.0–100.0)
MPV: 10.8 fL (ref 7.5–12.5)
Monocytes Relative: 8.6 %
Neutro Abs: 3428 cells/uL (ref 1500–7800)
Neutrophils Relative %: 56.2 %
Platelets: 302 10*3/uL (ref 140–400)
RBC: 4.35 10*6/uL (ref 3.80–5.10)
RDW: 14.3 % (ref 11.0–15.0)
Total Lymphocyte: 30.4 %
WBC: 6.1 10*3/uL (ref 3.8–10.8)

## 2022-10-29 LAB — COMPLETE METABOLIC PANEL WITH GFR
AG Ratio: 1.6 (calc) (ref 1.0–2.5)
ALT: 12 U/L (ref 6–29)
AST: 16 U/L (ref 10–35)
Albumin: 4.2 g/dL (ref 3.6–5.1)
Alkaline phosphatase (APISO): 49 U/L (ref 37–153)
BUN: 17 mg/dL (ref 7–25)
CO2: 30 mmol/L (ref 20–32)
Calcium: 9.9 mg/dL (ref 8.6–10.4)
Chloride: 101 mmol/L (ref 98–110)
Creat: 0.8 mg/dL (ref 0.60–1.00)
Globulin: 2.7 g/dL (calc) (ref 1.9–3.7)
Glucose, Bld: 117 mg/dL — ABNORMAL HIGH (ref 65–99)
Potassium: 4.1 mmol/L (ref 3.5–5.3)
Sodium: 140 mmol/L (ref 135–146)
Total Bilirubin: 0.5 mg/dL (ref 0.2–1.2)
Total Protein: 6.9 g/dL (ref 6.1–8.1)
eGFR: 75 mL/min/{1.73_m2} (ref >=60)

## 2022-10-30 LAB — LIPID PANEL
Cholesterol: 108 mg/dL (ref <200)
HDL: 48 mg/dL — ABNORMAL LOW (ref >=50)
LDL Cholesterol (Calc): 39 mg/dL (calc)
Non-HDL Cholesterol (Calc): 60 mg/dL (calc) (ref <130)
Total CHOL/HDL Ratio: 2.3 (calc) (ref <5.0)
Triglycerides: 126 mg/dL (ref <150)

## 2022-10-30 LAB — TSH: TSH: 1.54 mIU/L (ref 0.40–4.50)

## 2022-10-30 LAB — HEMOGLOBIN A1C
Hgb A1c MFr Bld: 6.5 % of total Hgb — ABNORMAL HIGH (ref <5.7)
Mean Plasma Glucose: 140 mg/dL
eAG (mmol/L): 7.7 mmol/L

## 2022-11-03 ENCOUNTER — Ambulatory Visit (INDEPENDENT_AMBULATORY_CARE_PROVIDER_SITE_OTHER): Payer: MEDICARE | Admitting: Podiatry

## 2022-11-03 ENCOUNTER — Encounter: Payer: Self-pay | Admitting: Podiatry

## 2022-11-03 DIAGNOSIS — M79675 Pain in left toe(s): Secondary | ICD-10-CM

## 2022-11-03 DIAGNOSIS — E119 Type 2 diabetes mellitus without complications: Secondary | ICD-10-CM

## 2022-11-03 DIAGNOSIS — M79674 Pain in right toe(s): Secondary | ICD-10-CM | POA: Diagnosis not present

## 2022-11-03 DIAGNOSIS — B351 Tinea unguium: Secondary | ICD-10-CM

## 2022-11-03 NOTE — Progress Notes (Signed)
This patient returns to my office for at risk foot care.  This patient requires this care by a professional since this patient will be at risk due to having diabetes with no complications.  This patient is unable to cut nails herself since the patient cannot reach her nails.These nails are painful walking and wearing shoes.  This patient presents for at risk foot care today.  General Appearance  Alert, conversant and in no acute stress.  Vascular  Dorsalis pedis and posterior tibial  pulses are palpable  bilaterally.  Capillary return is within normal limits  bilaterally. Temperature is within normal limits  bilaterally.  Neurologic  Senn-Weinstein monofilament wire test within normal limits  bilaterally. Muscle power within normal limits bilaterally.  Nails Thick disfigured discolored nails with subungual debris  from hallux to fifth toes bilaterally. No evidence of bacterial infection or drainage bilaterally.  Orthopedic  No limitations of motion  feet .  No crepitus or effusions noted.  No bony pathology or digital deformities noted. Midfoot DJD.  Skin  normotropic skin with no porokeratosis noted bilaterally.  No signs of infections or ulcers noted.     Onychomycosis  Pain in right toes  Pain in left toes  Consent was obtained for treatment procedures.   Mechanical debridement of nails 1-5  bilaterally performed with a nail nipper.  Filed with dremel without incident.    Return office visit    4 months                  Told patient to return for periodic foot care and evaluation due to potential at risk complications.   Gardiner Barefoot DPM

## 2022-11-04 ENCOUNTER — Encounter: Payer: Self-pay | Admitting: Internal Medicine

## 2022-11-04 ENCOUNTER — Non-Acute Institutional Stay: Payer: MEDICARE | Admitting: Internal Medicine

## 2022-11-04 VITALS — BP 128/72 | HR 81 | Temp 97.8°F | Resp 16 | Ht 64.0 in | Wt 169.0 lb

## 2022-11-04 DIAGNOSIS — E039 Hypothyroidism, unspecified: Secondary | ICD-10-CM | POA: Diagnosis not present

## 2022-11-04 DIAGNOSIS — E785 Hyperlipidemia, unspecified: Secondary | ICD-10-CM

## 2022-11-04 DIAGNOSIS — Z96611 Presence of right artificial shoulder joint: Secondary | ICD-10-CM | POA: Diagnosis not present

## 2022-11-04 DIAGNOSIS — E1169 Type 2 diabetes mellitus with other specified complication: Secondary | ICD-10-CM

## 2022-11-04 DIAGNOSIS — F5101 Primary insomnia: Secondary | ICD-10-CM | POA: Diagnosis not present

## 2022-11-04 DIAGNOSIS — D509 Iron deficiency anemia, unspecified: Secondary | ICD-10-CM

## 2022-11-04 MED ORDER — TRAZODONE HCL 50 MG PO TABS: 50.0000 mg | ORAL_TABLET | Freq: Every evening | ORAL | 3 refills | Status: DC | PRN

## 2022-11-04 MED ORDER — ROSUVASTATIN CALCIUM 20 MG PO TABS: 20.0000 mg | ORAL_TABLET | Freq: Every day | ORAL | 1 refills | Status: DC

## 2022-11-06 NOTE — Progress Notes (Unsigned)
Location:  [facility]:  Clinic (12)  Provider:   Code Status:  Goals of Care:     11/04/2022    3:15 PM  Advanced Directives  Does Patient Have a Medical Advance Directive? Yes  Type of Advance Directive Living will;Healthcare Power of Attorney  Copy of Healthcare Power of Attorney in Chart? No - copy requested     Chief Complaint  Patient presents with   Medical Management of Chronic Issues    Follow up and medication refills    HPI: Patient is a 82 y.o. female seen today for medical management of chronic diseases.    [place]   Has history of type 2 diabetes, iron deficiency anemia, hypothyroidism, chronic arthritis, hyperlipidemia    Now s/p Right Shoulder Arthroplasty on 10/05 Patient says she was using 8 Motrin in a day at one time to help her pain But now doing better with her Shoulder Still working with therapy Discharged from Ortho   Still has pain in Left shoulder Planning Surgery Next year  Doing well with Trazodone for sleep No Diarrhea More on Constipated side  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   Hypertension    Hypothyroidism     Past Surgical History:  Procedure Laterality Date   CATARACT EXTRACTION Bilateral 2016   CHOLECYSTECTOMY  1983   COLONOSCOPY  2018   Octavia Bruckner, MD/Per new patient packet   DG PORTABLE CHEST X RAY ([facility] HX)  2023   Shoulder/Per new patient packet   DIAGNOSTIC MAMMOGRAM  2022   Per new patient packet   REVERSE SHOULDER ARTHROPLASTY Right 07/30/2022   Procedure: REVERSE SHOULDER ARTHROPLASTY;  Surgeon: Francena Hanly, MD;  Location: [facility];  Service: Orthopedics;  Laterality: Right;    TOTAL KNEE ARTHROPLASTY Left 2017   TOTAL SHOULDER  ARTHROPLASTY  07/30/2022    Allergies  Allergen Reactions   Celecoxib Diarrhea    Other reaction(s): other   Quinidine Diarrhea   Codeine Rash    Outpatient Encounter Medications as of 11/04/2022  Medication Sig   acetaminophen (TYLENOL) 500 MG tablet Take 1,000 mg by mouth in the morning, at noon, in the evening, and at bedtime.   amoxicillin (AMOXIL) 500 MG tablet Take 500 mg by mouth 2 (two) times daily.   Calcium Carbonate-Vitamin D 600-5 MG-MCG TABS Take 1 tablet by mouth daily.   famotidine (PEPCID AC MAXIMUM STRENGTH) 20 MG tablet Take 20 mg by mouth daily.   fluticasone (FLONASE) 50 MCG/ACT nasal spray Place 1 spray into both nostrils daily as needed for allergies.   levothyroxine (SYNTHROID) 100 MCG tablet Take 1 tablet (100 mcg total) by mouth daily.   losartan-hydrochlorothiazide (HYZAAR) 100-25 MG tablet Take 1 tablet by mouth every morning.   Menthol, Topical Analgesic, (BIOFREEZE) 10 % CREA Apply 1 Application topically at bedtime.   metFORMIN (GLUCOPHAGE) 850 MG tablet Take 1 tablet (850 mg total) by mouth 3 (three) times daily.   oxybutynin (DITROPAN-XL) 5 MG 24 hr tablet Take 5 mg by mouth at bedtime.   sitaGLIPtin (JANUVIA) 100 MG tablet Take 1 tablet (100 mg total) by mouth daily.   traZODone (DESYREL) 50 MG tablet Take 1  tablet (50 mg total) by mouth at bedtime as needed for sleep.   [DISCONTINUED] rosuvastatin (CRESTOR) 20 MG tablet Take 20 mg by mouth at bedtime.   colestipol (COLESTID) 1 g tablet TAKE 1 TABLET BY MOUTH DAILY.   naproxen (NAPROSYN) 500 MG tablet Take 500 mg by mouth 2 (two) times daily.   rosuvastatin (CRESTOR) 20 MG tablet Take 1 tablet (20 mg total) by mouth at bedtime.   [DISCONTINUED] Ferrous Sulfate (IRON) 325 (65 Fe) MG TABS Take 325 mg by mouth 3 (three) times a week. Monday, Wednesday, Friday   [DISCONTINUED] levothyroxine (SYNTHROID) 100 MCG tablet Take 1 tablet (100 mcg total) by mouth daily.   [DISCONTINUED] metFORMIN (GLUCOPHAGE) 850 MG  tablet Take 1 tablet (850 mg total) by mouth 3 (three) times daily.   [DISCONTINUED] ondansetron (ZOFRAN) 4 MG tablet Take 1 tablet (4 mg total) by mouth every 8 (eight) hours as needed for nausea or vomiting.   [DISCONTINUED] sitaGLIPtin (JANUVIA) 100 MG tablet Take 1 tablet (100 mg total) by mouth daily.   [DISCONTINUED] tiZANidine (ZANAFLEX) 4 MG tablet Take 1 tablet (4 mg total) by mouth every 8 (eight) hours as needed for muscle spasms.   [DISCONTINUED] traZODone (DESYREL) 50 MG tablet Take 0.5-1 tablets (25-50 mg total) by mouth at bedtime as needed for sleep. Start with half tablet at night. If does not help can take full tablet in few days   [DISCONTINUED] traZODone (DESYREL) 50 MG tablet Take 50 mg by mouth at bedtime.   No facility-administered encounter medications on file as of 11/04/2022.    Review of Systems:  Review of Systems  Constitutional:  Negative for activity change and appetite change.  HENT: Negative.    Respiratory:  Negative for cough and shortness of breath.   Cardiovascular:  Negative for leg swelling.  Gastrointestinal:  Positive for constipation.  Genitourinary: Negative.   Musculoskeletal:  Positive for arthralgias and myalgias. Negative for gait problem.  Skin: Negative.   Neurological:  Negative for dizziness and weakness.  Psychiatric/Behavioral:  Negative for confusion, dysphoric mood and sleep disturbance.     Health Maintenance  Topic Date Due   OPHTHALMOLOGY EXAM  Never done   Diabetic kidney evaluation - Urine ACR  Never done   Hepatitis C Screening  Never done   DEXA SCAN  Never done   COVID-19 Vaccine (7 - 2023-24 season) 10/11/2022   Zoster Vaccines- Shingrix (1 of 2) 12/22/2022 (Originally 08/01/1993)   Pneumonia Vaccine 42+ Years old (1 - PCV) 11/05/2023 (Originally 08/01/2008)   FOOT EXAM  02/11/2023   HEMOGLOBIN A1C  04/29/2023   Medicare Annual Wellness (AWV)  09/22/2023   Diabetic kidney evaluation - eGFR measurement  10/30/2023    DTaP/Tdap/Td (3 - Td or Tdap) 05/23/2031   INFLUENZA VACCINE  Completed   HPV VACCINES  Aged Out    Physical Exam: Vitals:   11/04/22 1509  BP: 128/72  Pulse: 81  Resp: 16  Temp: 97.8 F (36.6 C)  TempSrc: Temporal  SpO2: 97%  Weight: 169 lb (76.7 kg)  Height: 5\' 4"  (1.626 m)   Body mass index is 29.01 kg/m. Physical Exam Vitals reviewed.  Constitutional:      Appearance: Normal appearance.  HENT:     Head: Normocephalic.     Nose: Nose normal.     Mouth/Throat:     Mouth: Mucous membranes are moist.     Pharynx: Oropharynx is clear.  Eyes:     Pupils: Pupils are equal, round, and reactive  to light.  Cardiovascular:     Rate and Rhythm: Normal rate and regular rhythm.     Pulses: Normal pulses.     Heart sounds: Normal heart sounds. No murmur heard. Pulmonary:     Effort: Pulmonary effort is normal.     Breath sounds: Normal breath sounds.  Abdominal:     General: Abdomen is flat. Bowel sounds are normal.     Palpations: Abdomen is soft.  Musculoskeletal:        General: No swelling.     Cervical back: Neck supple.  Skin:    General: Skin is warm.  Neurological:     General: No focal deficit present.     Mental Status: She is alert and oriented to person, place, and time.  Psychiatric:        Mood and Affect: Mood normal.        Thought Content: Thought content normal.     Labs reviewed: Basic Metabolic Panel: Recent Labs    03/12/22 0830 05/07/22 0840 07/24/22 0924 10/29/22 0810  NA 139  --  138 140  K 3.8  --  3.6 4.1  CL 102  --  105 101  CO2 29  --  26 30  GLUCOSE 112*  --  138* 117*  BUN 16  --  15 17  CREATININE 0.80  --  0.80 0.80  CALCIUM 9.8  --  9.8 9.9  TSH 0.23* 0.43  --  1.54   Liver Function Tests: Recent Labs    03/12/22 0830 10/29/22 0810  AST 20 16  ALT 18 12  BILITOT 0.4 0.5  PROT 6.7 6.9   No results for input(s): "LIPASE", "AMYLASE" in the last 8760 hours. No results for input(s): "AMMONIA" in the last 8760  hours. CBC: Recent Labs    03/12/22 0830 07/24/22 0924 10/29/22 0810  WBC 5.0 7.3 6.1  NEUTROABS 2,255  --  3,428  HGB 12.2 12.1 11.0*  HCT 37.4 37.8 34.5*  MCV 79.7* 82.2 79.3*  PLT 299 329 302   Lipid Panel: Recent Labs    03/12/22 0830 10/29/22 0810  CHOL 101 108  HDL 40* 48*  LDLCALC 42 39  TRIG 109 126  CHOLHDL 2.5 2.3   Lab Results  Component Value Date   HGBA1C 6.5 (H) 10/29/2022    Procedures since last visit: No results found.  Assessment/Plan 1. Type 2 diabetes mellitus with other specified complication, without long-term current use of insulin (HCC) Doing well oN Metformin Sometimes misses doses due to diarrhea  2. Acquired hypothyroidism TSH normal  3. Primary insomnia Doing well with Trazodone  4. History of arthroplasty of right shoulder Pain seems better controlled Again Discussed about slowly tapering NSAIDS  5. Hyperlipidemia, unspecified hyperlipidemia type On statin  6. Iron deficiency anemia, unspecified iron deficiency anemia type Patient was on iron at one point by her old PCP. And then she stopped it  Now she does have some microcytosis Colonoscopy 5 years ago per her Will repeat CBC and Iron studies If still Iron Def Consider  GI referal     Labs/tests ordered:  * No order type specified * Next appt:  01/28/2023

## 2022-12-27 ENCOUNTER — Other Ambulatory Visit: Payer: Self-pay | Admitting: Internal Medicine

## 2022-12-29 ENCOUNTER — Other Ambulatory Visit: Payer: Self-pay

## 2022-12-29 DIAGNOSIS — E1169 Type 2 diabetes mellitus with other specified complication: Secondary | ICD-10-CM

## 2022-12-29 DIAGNOSIS — D509 Iron deficiency anemia, unspecified: Secondary | ICD-10-CM

## 2023-01-28 ENCOUNTER — Other Ambulatory Visit: Payer: MEDICARE

## 2023-01-29 LAB — CBC WITH DIFFERENTIAL/PLATELET
Absolute Monocytes: 573 cells/uL (ref 200–950)
Basophils Absolute: 104 cells/uL (ref 0–200)
Basophils Relative: 1.5 %
Eosinophils Absolute: 283 cells/uL (ref 15–500)
Eosinophils Relative: 4.1 %
HCT: 33.6 % — ABNORMAL LOW (ref 35.0–45.0)
Hemoglobin: 10.5 g/dL — ABNORMAL LOW (ref 11.7–15.5)
Lymphs Abs: 2318.4 cells/uL (ref 850–3900)
MCH: 24.7 pg — ABNORMAL LOW (ref 27.0–33.0)
MCHC: 31.3 g/dL — ABNORMAL LOW (ref 32.0–36.0)
MCV: 79.1 fL — ABNORMAL LOW (ref 80.0–100.0)
MPV: 10.5 fL (ref 7.5–12.5)
Monocytes Relative: 8.3 %
Neutro Abs: 3623 cells/uL (ref 1500–7800)
Neutrophils Relative %: 52.5 %
Platelets: 318 10*3/uL (ref 140–400)
RBC: 4.25 10*6/uL (ref 3.80–5.10)
RDW: 15.1 % — ABNORMAL HIGH (ref 11.0–15.0)
Total Lymphocyte: 33.6 %
WBC: 6.9 10*3/uL (ref 3.8–10.8)

## 2023-01-29 LAB — HEMOGLOBIN A1C
Hgb A1c MFr Bld: 7.1 % of total Hgb — ABNORMAL HIGH (ref <5.7)
Mean Plasma Glucose: 157 mg/dL
eAG (mmol/L): 8.7 mmol/L

## 2023-01-29 LAB — IRON,TIBC AND FERRITIN PANEL
%SAT: 9 % (calc) — ABNORMAL LOW (ref 16–45)
Ferritin: 8 ng/mL — ABNORMAL LOW (ref 16–288)
Iron: 28 ug/dL — ABNORMAL LOW (ref 45–160)
TIBC: 321 mcg/dL (calc) (ref 250–450)

## 2023-01-29 LAB — VITAMIN B12: Vitamin B-12: 206 pg/mL (ref 200–1100)

## 2023-02-03 ENCOUNTER — Non-Acute Institutional Stay: Payer: MEDICARE | Admitting: Internal Medicine

## 2023-02-03 ENCOUNTER — Encounter: Payer: Self-pay | Admitting: Internal Medicine

## 2023-02-03 VITALS — BP 138/86 | HR 80 | Temp 97.8°F | Resp 17 | Ht 64.0 in | Wt 179.0 lb

## 2023-02-03 DIAGNOSIS — E538 Deficiency of other specified B group vitamins: Secondary | ICD-10-CM | POA: Diagnosis not present

## 2023-02-03 DIAGNOSIS — G8929 Other chronic pain: Secondary | ICD-10-CM

## 2023-02-03 DIAGNOSIS — D509 Iron deficiency anemia, unspecified: Secondary | ICD-10-CM

## 2023-02-03 DIAGNOSIS — E039 Hypothyroidism, unspecified: Secondary | ICD-10-CM | POA: Diagnosis not present

## 2023-02-03 DIAGNOSIS — E1169 Type 2 diabetes mellitus with other specified complication: Secondary | ICD-10-CM | POA: Diagnosis not present

## 2023-02-03 DIAGNOSIS — Z96611 Presence of right artificial shoulder joint: Secondary | ICD-10-CM

## 2023-02-03 DIAGNOSIS — E785 Hyperlipidemia, unspecified: Secondary | ICD-10-CM

## 2023-02-03 DIAGNOSIS — F5101 Primary insomnia: Secondary | ICD-10-CM

## 2023-02-03 MED ORDER — TRAMADOL HCL 50 MG PO TABS: 50.0000 mg | ORAL_TABLET | Freq: Every day | ORAL | 0 refills | Status: DC

## 2023-02-03 MED ORDER — OXYBUTYNIN CHLORIDE ER 5 MG PO TB24: 5.0000 mg | ORAL_TABLET | Freq: Every day | ORAL | 3 refills | Status: DC

## 2023-02-03 NOTE — Patient Instructions (Signed)
Iron 325 mg once a day Vit B 12 1000 Mcg Once a day

## 2023-02-03 NOTE — Progress Notes (Signed)
Location:  [facility]  Cl[facility] (12)  Provider:   Code Status:  Goals of Care:     02/03/2023   10:14 AM  Advanced Directives  Does Patient Have a Medical Advance Directive? Yes     Chief Complaint  Patient presents with   Medical Management of Chronic Issues    3 month follow up.    HPI: Patient is a 82 y.o. female seen today for medical management of chronic diseases.   [place]   Has history of type 2 diabetes, iron deficiency anemia, hypothyroidism, chronic arthritis, hyperlipidemia     Now s/p Right Shoulder Arthroplasty on 07/30/22 Doing really well Continue to take almost 4 advils in a day to help her pain in Left Shoulder and legs  No other issue Has gained some weight  A1C was also higher this time  She also has Iron def Anemia She was on iron then stopped it and her Hgb is low and Iron stores are low She had Colonoscopy few years ago and it was completely normal per patient and Notes from her Previous PCP  No Falls Walking well     Past Medical History:  Diagnosis Date   Arthritis    Diabetes mellitus without complication    H/O shoulder replacement    High cholesterol    Per new patient packet   History of hiatal hernia    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   Hypertension    Hypothyroidism     Past Surgical History:  Procedure Laterality Date   CATARACT EXTRACTION Bilateral 2016   CHOLECYSTECTOMY  1983   COLONOSCOPY  2018   Octavia Bruckner, MD/Per new patient packet   DG PORTABLE CHEST X RAY ([facility] HX)  2023   Shoulder/Per new patient packet   DIAGNOSTIC MAMMOGRAM  2022   Per new patient packet   REVERSE SHOULDER ARTHROPLASTY Right 07/30/2022   Procedure: REVERSE SHOULDER ARTHROPLASTY;  Surgeon: Francena Hanly, MD;  Location: [facility];  Service: Orthopedics;  Laterality: Right;    TOTAL KNEE ARTHROPLASTY Left 2017   TOTAL SHOULDER ARTHROPLASTY  07/30/2022     Allergies  Allergen Reactions   Celecoxib Diarrhea    Other reaction(s): other   Quinidine Diarrhea   Codeine Rash    Outpatient Encounter Medications as of 02/03/2023  Medication Sig   acetaminophen (TYLENOL) 500 MG tablet Take 1,000 mg by mouth in the morning, at noon, in the evening, and at bedtime.   amoxicillin (AMOXIL) 500 MG tablet Take 500 mg by mouth. Dental Prophlaxis   Calcium Carbonate-Vitamin D 600-5 MG-MCG TABS Take 1 tablet by mouth daily.   colestipol (COLESTID) 1 g tablet TAKE 1 TABLET BY MOUTH DAILY.   famotidine (PEPCID AC MAXIMUM STRENGTH) 20 MG tablet Take 20 mg by mouth daily.   fluticasone (FLONASE) 50 MCG/ACT nasal spray Place 1 spray into both nostrils daily as needed for allergies.   levothyroxine (SYNTHROID) 100 MCG tablet Take 1 tablet (100 mcg total) by mouth daily.   losartan-hydrochlorothiazide (HYZAAR) 100-25 MG tablet Take 1 tablet by mouth every morning.   Menthol, Topical Analgesic, (BIOFREEZE) 10 % CREA Apply 1 Application topically at bedtime.   metFORMIN (GLUCOPHAGE) 850 MG tablet Take 1 tablet (850 mg total) by mouth 3 (three) times daily.   rosuvastatin (CRESTOR) 20 MG tablet Take 1 tablet (20 mg total) by mouth at bedtime.   sitaGLIPtin (JANUVIA) 100 MG tablet Take  1 tablet (100 mg total) by mouth daily.   traMADol (ULTRAM) 50 MG tablet Take 1 tablet (50 mg total) by mouth daily.   traZODone (DESYREL) 50 MG tablet Take 1 tablet (50 mg total) by mouth at bedtime as needed for sleep.   [DISCONTINUED] oxybutynin (DITROPAN-XL) 5 MG 24 hr tablet Take 5 mg by mouth at bedtime.   oxybutynin (DITROPAN-XL) 5 MG 24 hr tablet Take 1 tablet (5 mg total) by mouth at bedtime.   [DISCONTINUED] naproxen (NAPROSYN) 500 MG tablet Take 500 mg by mouth 2 (two) times daily as needed. (Patient not taking: Reported on 02/03/2023)   No facility-administered encounter medications on file as of 02/03/2023.    Review of Systems:  Review of Systems  Constitutional:   Negative for activity change and appetite change.  HENT: Negative.    Respiratory:  Negative for cough and shortness of breath.   Cardiovascular:  Negative for leg swelling.  Gastrointestinal:  Negative for constipation.  Genitourinary: Negative.   Musculoskeletal:  Positive for arthralgias and myalgias. Negative for gait problem.  Skin: Negative.   Neurological:  Negative for dizziness and weakness.  Psychiatric/Behavioral:  Negative for confusion, dysphoric mood and sleep disturbance.     Health Maintenance  Topic Date Due   COVID-19 Vaccine (7 - 2023-24 season) 10/11/2022   OPHTHALMOLOGY EXAM  02/03/2023 (Originally 08/01/1951)   Diabetic kidney evaluation - Urine ACR  02/04/2023 (Originally 08/01/1959)   Zoster Vaccines- Shingrix (1 of 2) 05/05/2023 (Originally 08/01/1993)   Pneumonia Vaccine 13+ Years old (1 of 1 - PCV) 11/05/2023 (Originally 08/01/2008)   DEXA SCAN  02/03/2024 (Originally 08/01/2008)   Hepatitis C Screening  02/03/2024 (Originally 08/01/1959)   FOOT EXAM  02/11/2023   INFLUENZA VACCINE  05/27/2023   HEMOGLOBIN A1C  07/30/2023   Medicare Annual Wellness (AWV)  09/22/2023   Diabetic kidney evaluation - eGFR measurement  10/30/2023   DTaP/Tdap/Td (3 - Td or Tdap) 05/23/2031   HPV VACCINES  Aged Out    Physical Exam: Vitals:   02/03/23 1010  BP: 138/86  Pulse: 80  Resp: 17  Temp: 97.8 F (36.6 C)  TempSrc: Temporal  SpO2: 97%  Weight: 179 lb (81.2 kg)  Height: 5\' 4"  (1.626 m)   Body mass index is 30.73 kg/m. Physical Exam Vitals reviewed.  Constitutional:      Appearance: Normal appearance.  HENT:     Head: Normocephalic.     Nose: Nose normal.     Mouth/Throat:     Mouth: Mucous membranes are moist.     Pharynx: Oropharynx is clear.  Eyes:     Pupils: Pupils are equal, round, and reactive to light.  Cardiovascular:     Rate and Rhythm: Normal rate and regular rhythm.     Pulses: Normal pulses.     Heart sounds: Normal heart sounds. No murmur  heard. Pulmonary:     Effort: Pulmonary effort is normal.     Breath sounds: Normal breath sounds.  Abdominal:     General: Abdomen is flat. Bowel sounds are normal.     Palpations: Abdomen is soft.  Musculoskeletal:        General: No swelling.     Cervical back: Neck supple.  Skin:    General: Skin is warm.  Neurological:     General: No focal deficit present.     Mental Status: She is alert and oriented to person, place, and time.  Psychiatric:        Mood and Affect:  Mood normal.        Thought Content: Thought content normal.     Labs reviewed: Basic Metabolic Panel: Recent Labs    03/12/22 0830 05/07/22 0840 07/24/22 0924 10/29/22 0810  NA 139  --  138 140  K 3.8  --  3.6 4.1  CL 102  --  105 101  CO2 29  --  26 30  GLUCOSE 112*  --  138* 117*  BUN 16  --  15 17  CREATININE 0.80  --  0.80 0.80  CALCIUM 9.8  --  9.8 9.9  TSH 0.23* 0.43  --  1.54   Liver Function Tests: Recent Labs    03/12/22 0830 10/29/22 0810  AST 20 16  ALT 18 12  BILITOT 0.4 0.5  PROT 6.7 6.9   No results for input(s): "LIPASE", "AMYLASE" in the last 8760 hours. No results for input(s): "AMMONIA" in the last 8760 hours. CBC: Recent Labs    03/12/22 0830 07/24/22 0924 10/29/22 0810 01/28/23 0820  WBC 5.0 7.3 6.1 6.9  NEUTROABS 2,255  --  3,428 3,623  HGB 12.2 12.1 11.0* 10.5*  HCT 37.4 37.8 34.5* 33.6*  MCV 79.7* 82.2 79.3* 79.1*  PLT 299 329 302 318   Lipid Panel: Recent Labs    03/12/22 0830 10/29/22 0810  CHOL 101 108  HDL 40* 48*  LDLCALC 42 39  TRIG 109 126  CHOLHDL 2.5 2.3   Lab Results  Component Value Date   HGBA1C 7.1 (H) 01/28/2023    Procedures since last visit: No results found.  Assessment/Plan 1. Iron deficiency anemia, unspecified iron deficiency anemia type Will restart Iron Patient had colonoscopy few years ago and it was good No obvious Bleeding   2. Type 2 diabetes mellitus with other specified complication, without long-term current  use of insulin A1C mildily elevated She had missed some doses of Metformin Continue Diet and Exercise Need to follow with Opthalmology  3. Acquired hypothyroidism TSH normal in 10/2022  4. Vitamin B12 deficiency Restart her on Vit B12 1000 mcg  5. Hyperlipidemia, unspecified hyperlipidemia type LDL good on Crestor  6. History of arthroplasty of right shoulder Doing well with her shoulder  7. Primary insomnia Uses Trazodone PRN  8. Other chronic pain Patient taking almost 6-8 Advil for pain We discussed about cutting back and try Tramadol instead She is agreeable Will start her on 50 mg in the morning 9 Urinary Incontinence On Ditropan  10 HTN Continue Hyzaar 11 Post Cholecystectomy diarrhea Takes Colestid prn  Labs/tests ordered:  * No order type specified * Next appt:  05/19/2023

## 2023-03-01 ENCOUNTER — Ambulatory Visit (INDEPENDENT_AMBULATORY_CARE_PROVIDER_SITE_OTHER): Payer: MEDICARE | Admitting: Podiatry

## 2023-03-01 ENCOUNTER — Encounter: Payer: Self-pay | Admitting: Podiatry

## 2023-03-01 DIAGNOSIS — M79674 Pain in right toe(s): Secondary | ICD-10-CM

## 2023-03-01 DIAGNOSIS — B351 Tinea unguium: Secondary | ICD-10-CM

## 2023-03-01 DIAGNOSIS — M79675 Pain in left toe(s): Secondary | ICD-10-CM

## 2023-03-01 DIAGNOSIS — E119 Type 2 diabetes mellitus without complications: Secondary | ICD-10-CM

## 2023-03-01 NOTE — Progress Notes (Signed)
This patient returns to my office for at risk foot care.  This patient requires this care by a professional since this patient will be at risk due to having diabetes with no complications.  This patient is unable to cut nails herself since the patient cannot reach her nails.These nails are painful walking and wearing shoes.  This patient presents for at risk foot care today.  General Appearance  Alert, conversant and in no acute stress.  Vascular  Dorsalis pedis and posterior tibial  pulses are palpable  bilaterally.  Capillary return is within normal limits  bilaterally. Temperature is within normal limits  bilaterally.  Neurologic  Senn-Weinstein monofilament wire test within normal limits  bilaterally. Muscle power within normal limits bilaterally.  Nails Thick disfigured discolored nails with subungual debris  from hallux to fifth toes bilaterally. No evidence of bacterial infection or drainage bilaterally.  Orthopedic  No limitations of motion  feet .  No crepitus or effusions noted.  No bony pathology or digital deformities noted. Midfoot DJD.  Skin  normotropic skin with no porokeratosis noted bilaterally.  No signs of infections or ulcers noted.     Onychomycosis  Pain in right toes  Pain in left toes  Consent was obtained for treatment procedures.   Mechanical debridement of nails 1-5  bilaterally performed with a nail nipper.  Filed with dremel without incident.    Return office visit    4 months                  Told patient to return for periodic foot care and evaluation due to potential at risk complications.   Euretha Najarro DPM   

## 2023-03-05 ENCOUNTER — Other Ambulatory Visit: Payer: Self-pay | Admitting: Internal Medicine

## 2023-03-05 ENCOUNTER — Other Ambulatory Visit: Payer: Self-pay

## 2023-03-05 ENCOUNTER — Other Ambulatory Visit: Payer: Self-pay | Admitting: Orthopedic Surgery

## 2023-03-05 DIAGNOSIS — E039 Hypothyroidism, unspecified: Secondary | ICD-10-CM

## 2023-03-05 DIAGNOSIS — Z0000 Encounter for general adult medical examination without abnormal findings: Secondary | ICD-10-CM

## 2023-03-05 MED ORDER — LOSARTAN POTASSIUM-HCTZ 100-25 MG PO TABS
1.0000 | ORAL_TABLET | Freq: Every morning | ORAL | 1 refills | Status: DC
  Filled 2023-03-05: qty 90, 90d supply, fill #0

## 2023-03-05 NOTE — Telephone Encounter (Signed)
Pharmacy requested refill.  Pended Rx and sent to Dr. Chales Abrahams for approval due to HIGH ALERT warning.

## 2023-03-05 NOTE — Telephone Encounter (Signed)
Pharmacy requested refills  Pended Rx's and sent to Dr. Chales Abrahams for approval due to HIGH ALERT Warning.

## 2023-03-08 ENCOUNTER — Other Ambulatory Visit: Payer: Self-pay

## 2023-04-26 LAB — HM DIABETES EYE EXAM

## 2023-04-27 IMAGING — DX DG SHOULDER 2+V*R*
3 series · 3 of 3 positions shown · non-contrast
Comparison: None.

CLINICAL DATA: Pain after fall

EXAM:
RIGHT SHOULDER - 2+ VIEW

[shoulder axial (1 of 2)]
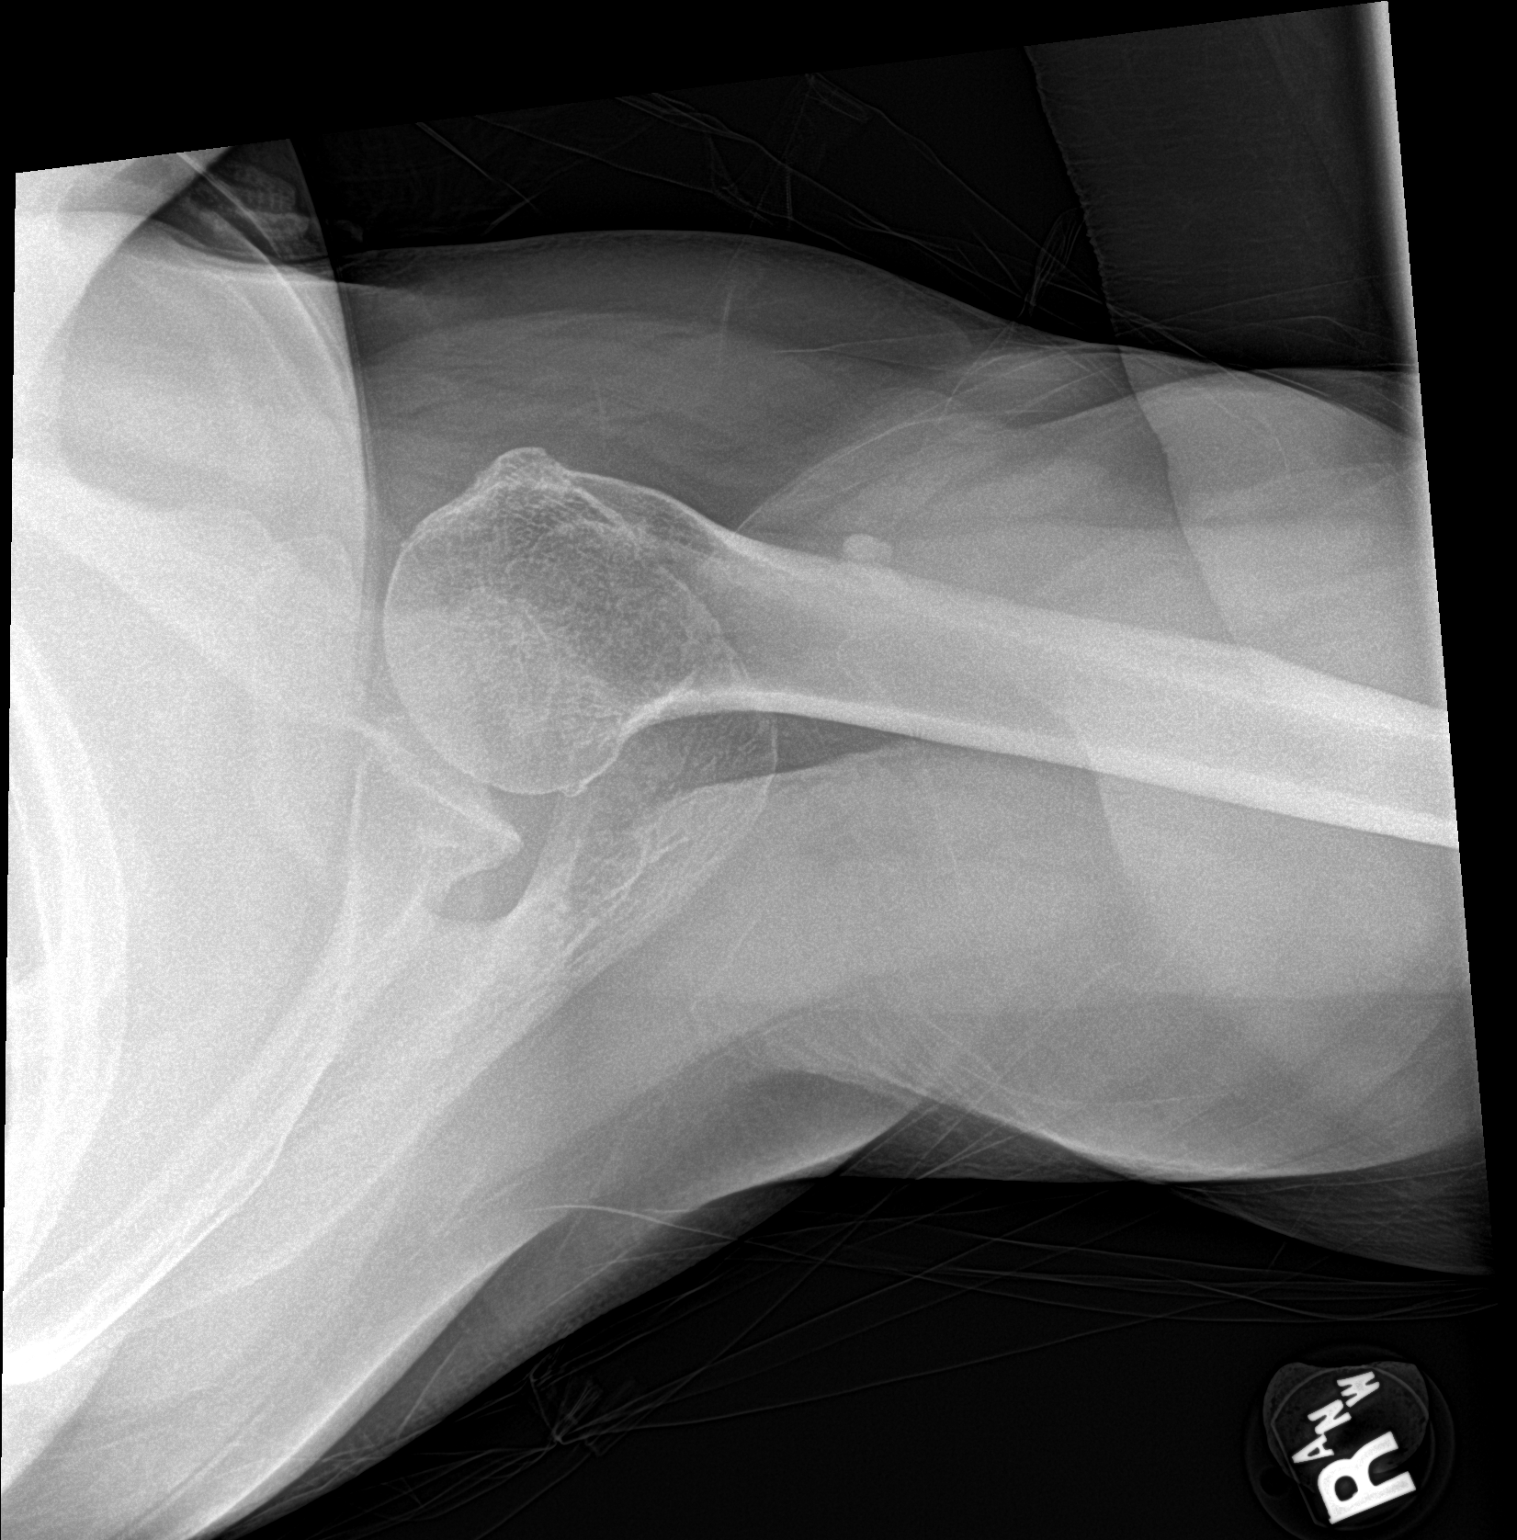

[shoulder ap]
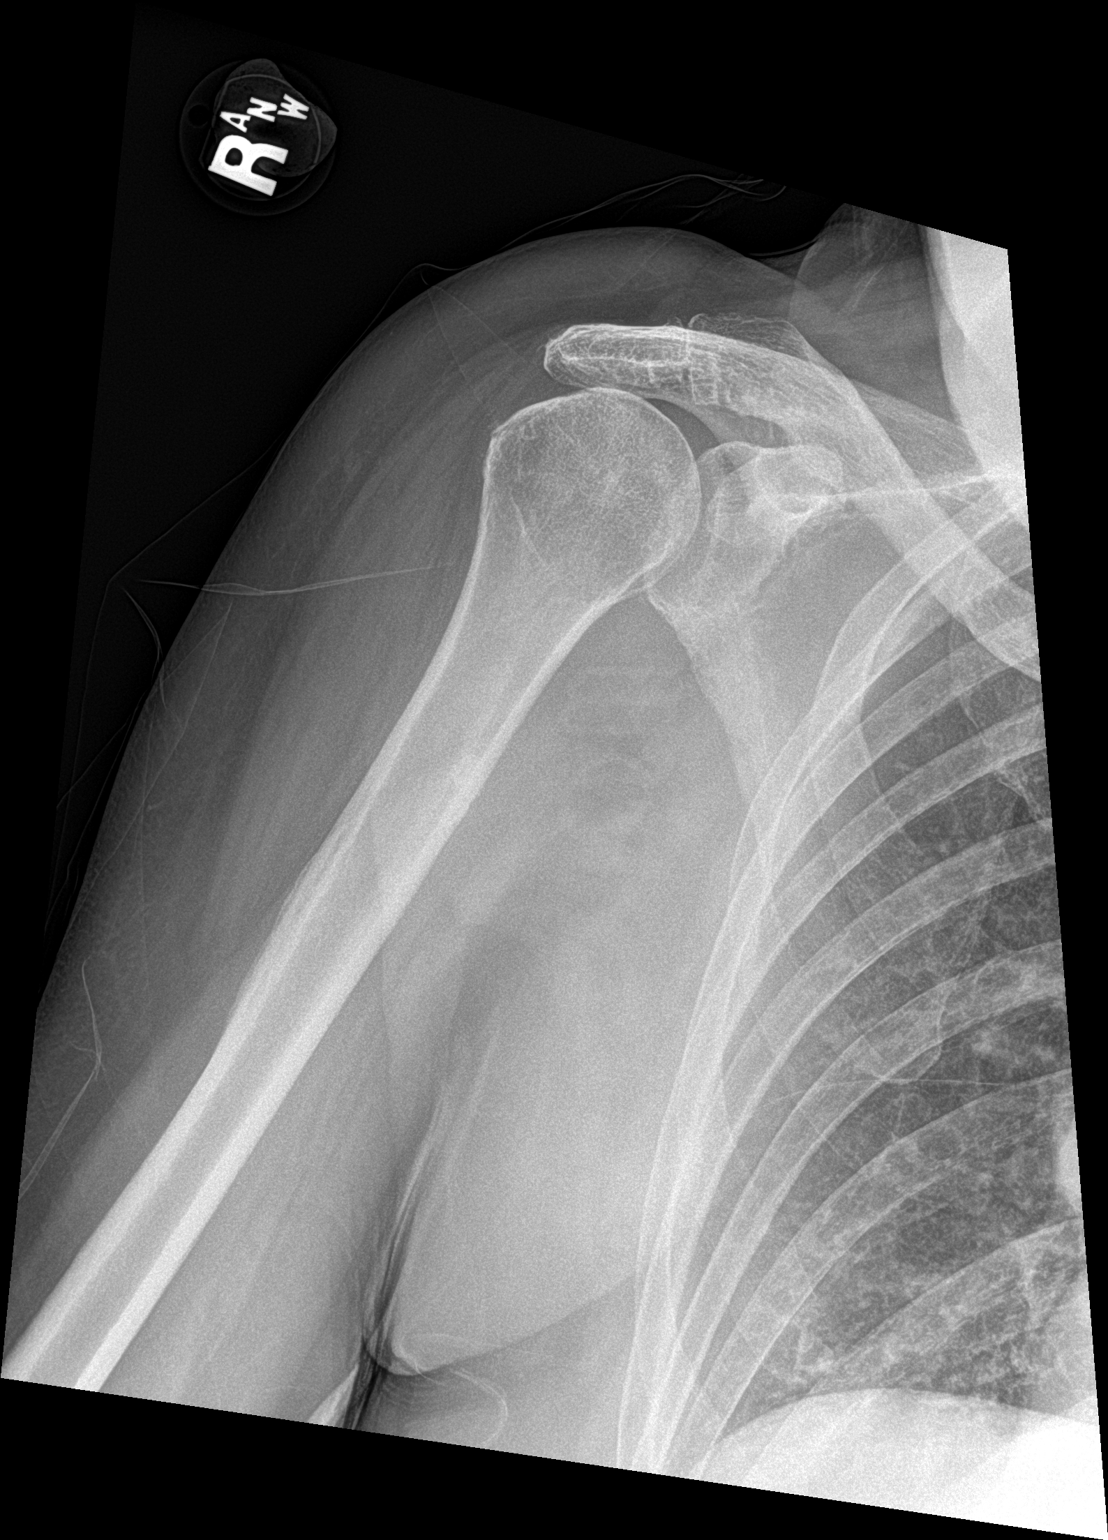

[shoulder axial (2 of 2)]
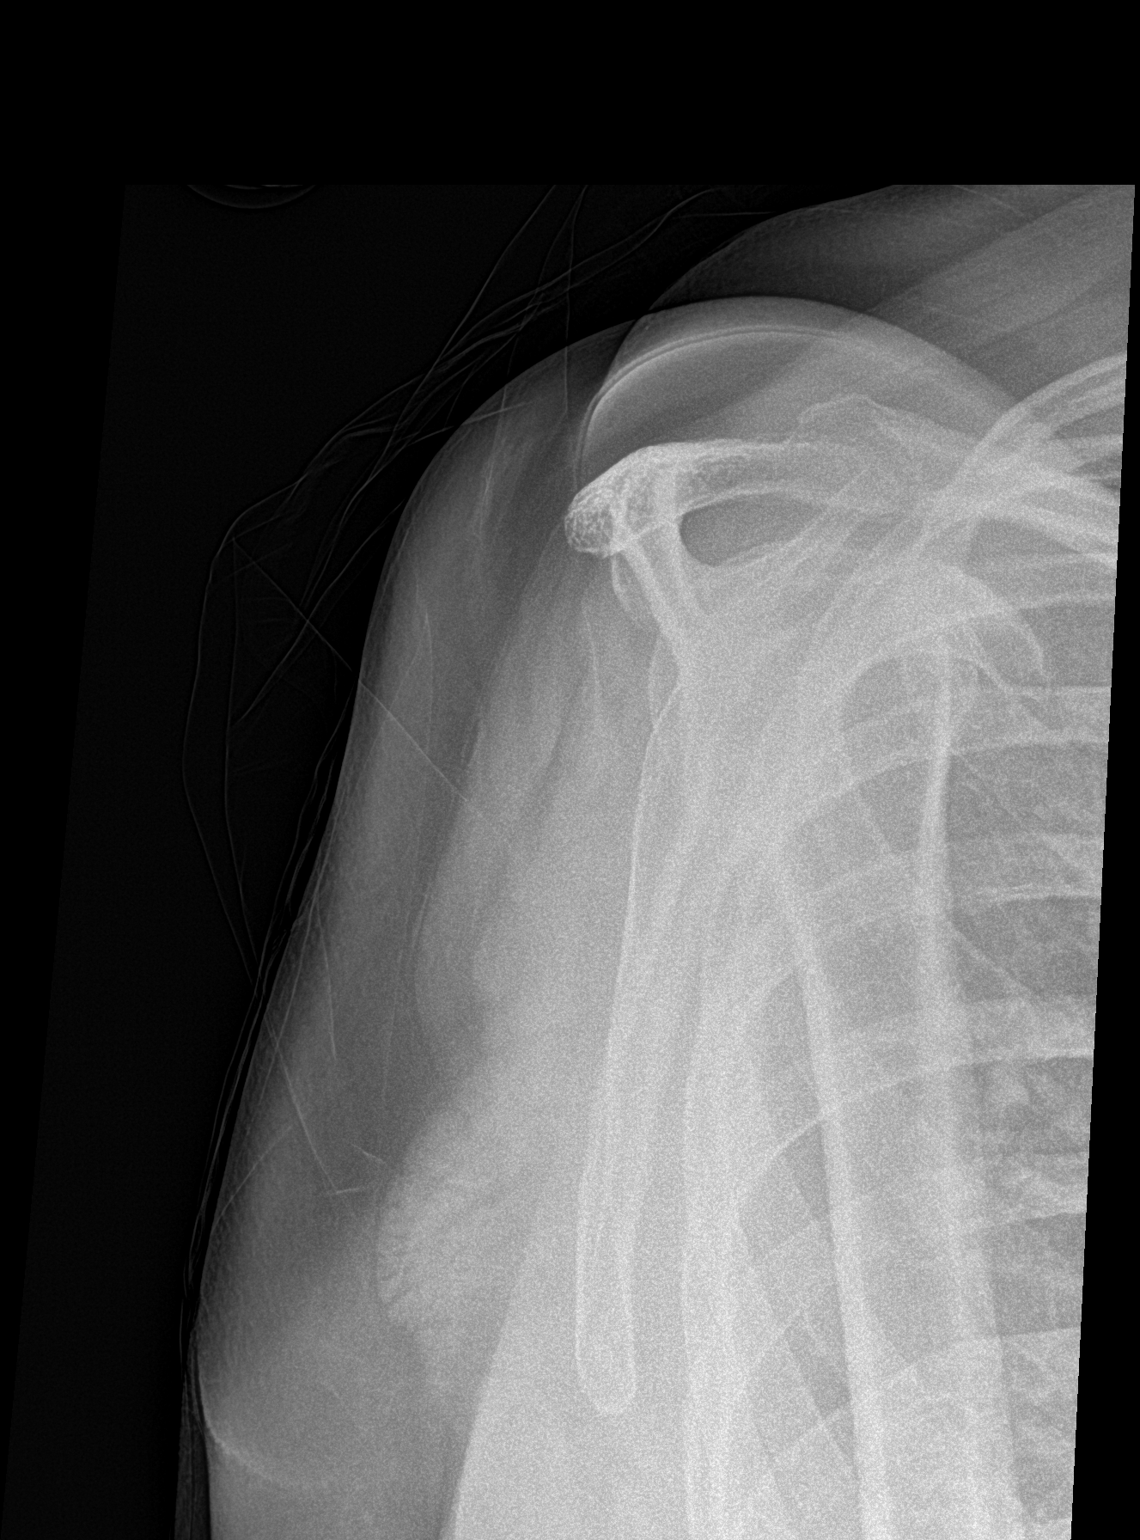

[3 of 3 positions shown; findings below may reference images not displayed]

FINDINGS: There is no evidence of fracture or dislocation. Normal alignment.
Joint spaces are preserved. Minimal degenerative spurring of the
acromioclavicular joint. There is no evidence of arthropathy or
other focal bone abnormality. Soft tissues are unremarkable.
IMPRESSION: No fracture or dislocation of the right shoulder.

## 2023-05-19 ENCOUNTER — Non-Acute Institutional Stay: Payer: MEDICARE | Admitting: Internal Medicine

## 2023-05-19 ENCOUNTER — Encounter: Payer: Self-pay | Admitting: Internal Medicine

## 2023-05-19 VITALS — BP 136/82 | HR 90 | Temp 97.8°F | Resp 18 | Ht 64.0 in | Wt 184.1 lb

## 2023-05-19 DIAGNOSIS — E538 Deficiency of other specified B group vitamins: Secondary | ICD-10-CM

## 2023-05-19 DIAGNOSIS — G8929 Other chronic pain: Secondary | ICD-10-CM

## 2023-05-19 DIAGNOSIS — Z96611 Presence of right artificial shoulder joint: Secondary | ICD-10-CM

## 2023-05-19 DIAGNOSIS — D509 Iron deficiency anemia, unspecified: Secondary | ICD-10-CM

## 2023-05-19 DIAGNOSIS — E1169 Type 2 diabetes mellitus with other specified complication: Secondary | ICD-10-CM | POA: Diagnosis not present

## 2023-05-19 DIAGNOSIS — Z1231 Encounter for screening mammogram for malignant neoplasm of breast: Secondary | ICD-10-CM

## 2023-05-19 DIAGNOSIS — E039 Hypothyroidism, unspecified: Secondary | ICD-10-CM

## 2023-05-19 NOTE — Patient Instructions (Signed)
Try Tylenol 650 mg 3-4 times a day instead of Naprosyn Can take tramadol at night for pain Senna Plus one to 2 tablets to help with Constipation

## 2023-05-20 NOTE — Progress Notes (Signed)
Location:  [facility]:  Clinic (12)  Provider:   Code Status:  Goals of Care:     05/19/2023   10:43 AM  Advanced Directives  Does Patient Have a Medical Advance Directive? Yes  Type of Estate agent of [place];Living will  Does patient want to make changes to medical advance directive? No - Patient declined  Copy of Healthcare Power of Attorney in Chart? No - copy requested     Chief Complaint  Patient presents with   Medical Management of Chronic Issues    Patient is here for a 3 month follow up   Immunizations    Patient is due for shingles and updated covid vaccine   Health Maintenance    Patient due for foot exam and diabetic kidney evaluation     HPI: Patient is a 82 y.o. female seen today for medical management of chronic diseases.    [place]   Has history of type 2 diabetes, iron deficiency anemia, hypothyroidism, chronic arthritis, hyperlipidemia     Now s/p Right Shoulder Arthroplasty on 07/30/22 Doing really well Taking Naprosyn for pain Also Tramadol I started last visit which is helping  Diabetes A1C was elevated due to missing Metformin Now being more diligent  She also has Iron def Anemia She was on iron then stopped it and her Hgb is low and Iron stores are low She had Colonoscopy few years ago and it was completely normal per patient and Notes from her Previous PCP  Constipation Due to using Iron Has stopped her Colestid     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   Hypertension    Hypothyroidism     Past Surgical History:  Procedure Laterality Date   CATARACT EXTRACTION Bilateral 2016   CHOLECYSTECTOMY  1983   COLONOSCOPY  2018   Octavia Bruckner, MD/Per new patient packet   DG PORTABLE CHEST X RAY ([facility] HX)  2023   Shoulder/Per new patient packet   DIAGNOSTIC MAMMOGRAM  2022   Per new patient packet   REVERSE SHOULDER ARTHROPLASTY Right 07/30/2022   Procedure: REVERSE SHOULDER ARTHROPLASTY;  Surgeon: Francena Hanly, MD;  Location: [facility];  Service: Orthopedics;  Laterality: Right;    TOTAL KNEE ARTHROPLASTY Left 2017   TOTAL SHOULDER ARTHROPLASTY  07/30/2022    Allergies  Allergen Reactions   Celecoxib Diarrhea    Other reaction(s): other   Quinidine Diarrhea   Codeine Rash    Outpatient Encounter Medications as of 05/19/2023  Medication Sig   acetaminophen (TYLENOL) 500 MG tablet Take 1,000 mg by mouth in the morning, at noon, in the evening, and at bedtime.   amoxicillin (AMOXIL) 500 MG tablet Take 500 mg by mouth. Dental Prophlaxis   Calcium Carbonate-Vitamin D 600-5 MG-MCG TABS Take 1 tablet by mouth daily.   colestipol (COLESTID) 1 g tablet TAKE 1 TABLET BY MOUTH DAILY.   famotidine (PEPCID AC MAXIMUM STRENGTH) 20 MG tablet Take 20 mg by mouth daily.   fluticasone (FLONASE) 50 MCG/ACT nasal spray Place 1 spray into both nostrils daily as needed for allergies.   levothyroxine (SYNTHROID) 100 MCG tablet TAKE 1 TABLET BY MOUTH EVERY DAY  losartan-hydrochlorothiazide (HYZAAR) 100-25 MG tablet Take 1 tablet by mouth every morning.   Menthol, Topical Analgesic, (BIOFREEZE) 10 % CREA Apply 1 Application topically at bedtime.   metFORMIN (GLUCOPHAGE) 850 MG tablet TAKE 1 TABLET BY MOUTH 3 TIMES DAILY.   naproxen (NAPROSYN) 500 MG tablet Take 500 mg by mouth 2 (two) times daily with a meal.   oxybutynin (DITROPAN-XL) 5 MG 24 hr tablet Take 1 tablet (5 mg total) by mouth at bedtime.   rosuvastatin (CRESTOR) 20 MG tablet TAKE 1 TABLET BY MOUTH EVERYDAY AT BEDTIME   sitaGLIPtin (JANUVIA) 100 MG tablet Take 1 tablet (100 mg total) by mouth daily.   traMADol (ULTRAM) 50 MG tablet Take 1 tablet (50 mg total) by mouth  daily.   traZODone (DESYREL) 50 MG tablet TAKE 1/2 TO 1 TABLET AT BEDTIME AS NEEDED FOR SLEEP *START WITH 1/2 MAY INCREASE TO 1 FULL TABLET*   No facility-administered encounter medications on file as of 05/19/2023.    Review of Systems:  Review of Systems  Constitutional:  Negative for activity change and appetite change.  HENT: Negative.    Respiratory:  Negative for cough and shortness of breath.   Cardiovascular:  Negative for leg swelling.  Gastrointestinal:  Positive for constipation.  Genitourinary: Negative.   Musculoskeletal:  Positive for myalgias. Negative for arthralgias and gait problem.  Skin: Negative.   Neurological:  Negative for dizziness and weakness.  Psychiatric/Behavioral:  Negative for confusion, dysphoric mood and sleep disturbance.     Health Maintenance  Topic Date Due   Diabetic kidney evaluation - Urine ACR  Never done   Zoster Vaccines- Shingrix (1 of 2) 08/01/1993   COVID-19 Vaccine (7 - 2023-24 season) 10/11/2022   FOOT EXAM  02/11/2023   Pneumonia Vaccine 13+ Years old (1 of 1 - PCV) 11/05/2023 (Originally 08/01/2008)   DEXA SCAN  02/03/2024 (Originally 08/01/2008)   Hepatitis C Screening  02/03/2024 (Originally 08/01/1959)   INFLUENZA VACCINE  05/27/2023   HEMOGLOBIN A1C  07/30/2023   Diabetic kidney evaluation - eGFR measurement  10/30/2023   Medicare Annual Wellness (AWV)  03/04/2024   OPHTHALMOLOGY EXAM  04/25/2024   DTaP/Tdap/Td (3 - Td or Tdap) 05/23/2031   HPV VACCINES  Aged Out    Physical Exam: Vitals:   05/19/23 1040  BP: 136/82  Pulse: 90  Resp: 18  Temp: 97.8 F (36.6 C)  TempSrc: Temporal  SpO2: 97%  Weight: 184 lb 1.6 oz (83.5 kg)  Height: 5\' 4"  (1.626 m)   Body mass index is 31.6 kg/m. Physical Exam Vitals reviewed.  Constitutional:      Appearance: Normal appearance.  HENT:     Head: Normocephalic.     Nose: Nose normal.     Mouth/Throat:     Mouth: Mucous membranes are moist.     Pharynx: Oropharynx is clear.   Eyes:     Pupils: Pupils are equal, round, and reactive to light.  Cardiovascular:     Rate and Rhythm: Normal rate and regular rhythm.     Pulses: Normal pulses.     Heart sounds: Normal heart sounds. No murmur heard. Pulmonary:     Effort: Pulmonary effort is normal.     Breath sounds: Normal breath sounds.  Abdominal:     General: Abdomen is flat. Bowel sounds are normal.     Palpations: Abdomen is soft.  Musculoskeletal:        General: No swelling.     Cervical back: Neck supple.  Skin:  General: Skin is warm.  Neurological:     General: No focal deficit present.     Mental Status: She is alert and oriented to person, place, and time.  Psychiatric:        Mood and Affect: Mood normal.        Thought Content: Thought content normal.     Labs reviewed: Basic Metabolic Panel: Recent Labs    07/24/22 0924 10/29/22 0810  NA 138 140  K 3.6 4.1  CL 105 101  CO2 26 30  GLUCOSE 138* 117*  BUN 15 17  CREATININE 0.80 0.80  CALCIUM 9.8 9.9  TSH  --  1.54   Liver Function Tests: Recent Labs    10/29/22 0810  AST 16  ALT 12  BILITOT 0.5  PROT 6.9   No results for input(s): "LIPASE", "AMYLASE" in the last 8760 hours. No results for input(s): "AMMONIA" in the last 8760 hours. CBC: Recent Labs    07/24/22 0924 10/29/22 0810 01/28/23 0820  WBC 7.3 6.1 6.9  NEUTROABS  --  3,428 3,623  HGB 12.1 11.0* 10.5*  HCT 37.8 34.5* 33.6*  MCV 82.2 79.3* 79.1*  PLT 329 302 318   Lipid Panel: Recent Labs    10/29/22 0810  CHOL 108  HDL 48*  LDLCALC 39  TRIG 086  CHOLHDL 2.3   Lab Results  Component Value Date   HGBA1C 7.1 (H) 01/28/2023    Procedures since last visit: No results found.  Assessment/Plan 1. Iron deficiency anemia, unspecified iron deficiency anemia type On Iron Previous Work up has been negative Repeat labs before next visit  2. Type 2 diabetes mellitus with other specified complication, without long-term current use of insulin  (HCC) Was high as she had missed Metformin Repeat before next visit  3. Acquired hypothyroidism Repeat   4. Vitamin B12 deficiency On Supplement  5. History of arthroplasty of right shoulder Taper off Naprosyn and try Tylenol Can take Tramadol as needed  6. 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

## 2023-06-02 ENCOUNTER — Ambulatory Visit (INDEPENDENT_AMBULATORY_CARE_PROVIDER_SITE_OTHER): Payer: MEDICARE | Admitting: Podiatry

## 2023-06-02 ENCOUNTER — Encounter: Payer: Self-pay | Admitting: Podiatry

## 2023-06-02 DIAGNOSIS — E119 Type 2 diabetes mellitus without complications: Secondary | ICD-10-CM

## 2023-06-02 DIAGNOSIS — M79675 Pain in left toe(s): Secondary | ICD-10-CM | POA: Diagnosis not present

## 2023-06-02 DIAGNOSIS — M79674 Pain in right toe(s): Secondary | ICD-10-CM | POA: Diagnosis not present

## 2023-06-02 DIAGNOSIS — B351 Tinea unguium: Secondary | ICD-10-CM | POA: Diagnosis not present

## 2023-06-02 NOTE — Progress Notes (Signed)
This patient returns to my office for at risk foot care.  This patient requires this care by a professional since this patient will be at risk due to having diabetes with no complications.  This patient is unable to cut nails herself since the patient cannot reach her nails.These nails are painful walking and wearing shoes.  This patient presents for at risk foot care today.  General Appearance  Alert, conversant and in no acute stress.  Vascular  Dorsalis pedis and posterior tibial  pulses are palpable  bilaterally.  Capillary return is within normal limits  bilaterally. Temperature is within normal limits  bilaterally.  Neurologic  Senn-Weinstein monofilament wire test within normal limits  bilaterally. Muscle power within normal limits bilaterally.  Nails Thick disfigured discolored nails with subungual debris  from hallux to fifth toes bilaterally. No evidence of bacterial infection or drainage bilaterally.  Orthopedic  No limitations of motion  feet .  No crepitus or effusions noted.  No bony pathology or digital deformities noted. Midfoot DJD. Plantar positioning of spur left foot.  Skin  normotropic skin with no porokeratosis noted bilaterally.  No signs of infections or ulcers noted.     Onychomycosis  Pain in right toes  Pain in left toes  Consent was obtained for treatment procedures.   Mechanical debridement of nails 1-5  bilaterally performed with a nail nipper.  Filed with dremel without incident.    Return office visit    4 months                  Told patient to return for periodic foot care and evaluation due to potential at risk complications.   Helane Gunther DPM

## 2023-08-26 ENCOUNTER — Other Ambulatory Visit: Payer: MEDICARE

## 2023-08-27 LAB — CBC WITH DIFFERENTIAL/PLATELET
Absolute Lymphocytes: 2058 {cells}/uL (ref 850–3900)
Absolute Monocytes: 721 {cells}/uL (ref 200–950)
Basophils Absolute: 63 {cells}/uL (ref 0–200)
Basophils Relative: 0.9 %
Eosinophils Absolute: 210 {cells}/uL (ref 15–500)
Eosinophils Relative: 3 %
HCT: 36.3 % (ref 35.0–45.0)
Hemoglobin: 11.5 g/dL — ABNORMAL LOW (ref 11.7–15.5)
MCH: 25.4 pg — ABNORMAL LOW (ref 27.0–33.0)
MCHC: 31.7 g/dL — ABNORMAL LOW (ref 32.0–36.0)
MCV: 80.1 fL (ref 80.0–100.0)
MPV: 9.9 fL (ref 7.5–12.5)
Monocytes Relative: 10.3 %
Neutro Abs: 3948 {cells}/uL (ref 1500–7800)
Neutrophils Relative %: 56.4 %
Platelets: 319 10*3/uL (ref 140–400)
RBC: 4.53 10*6/uL (ref 3.80–5.10)
RDW: 14 % (ref 11.0–15.0)
Total Lymphocyte: 29.4 %
WBC: 7 10*3/uL (ref 3.8–10.8)

## 2023-08-27 LAB — COMPLETE METABOLIC PANEL WITH GFR
AG Ratio: 1.6 (calc) (ref 1.0–2.5)
ALT: 15 U/L (ref 6–29)
AST: 17 U/L (ref 10–35)
Albumin: 4.2 g/dL (ref 3.6–5.1)
Alkaline phosphatase (APISO): 56 U/L (ref 37–153)
BUN: 14 mg/dL (ref 7–25)
CO2: 30 mmol/L (ref 20–32)
Calcium: 9.9 mg/dL (ref 8.6–10.4)
Chloride: 100 mmol/L (ref 98–110)
Creat: 0.87 mg/dL (ref 0.60–0.95)
Globulin: 2.7 g/dL (ref 1.9–3.7)
Glucose, Bld: 130 mg/dL — ABNORMAL HIGH (ref 65–99)
Potassium: 4 mmol/L (ref 3.5–5.3)
Sodium: 140 mmol/L (ref 135–146)
Total Bilirubin: 0.5 mg/dL (ref 0.2–1.2)
Total Protein: 6.9 g/dL (ref 6.1–8.1)
eGFR: 67 mL/min/{1.73_m2} (ref >=60)

## 2023-08-27 LAB — LIPID PANEL
Cholesterol: 104 mg/dL (ref <200)
HDL: 41 mg/dL — ABNORMAL LOW (ref >=50)
LDL Cholesterol (Calc): 40 mg/dL
Non-HDL Cholesterol (Calc): 63 mg/dL (ref <130)
Total CHOL/HDL Ratio: 2.5 (calc) (ref <5.0)
Triglycerides: 156 mg/dL — ABNORMAL HIGH (ref <150)

## 2023-08-27 LAB — HEMOGLOBIN A1C
Hgb A1c MFr Bld: 7 %{Hb} — ABNORMAL HIGH (ref <5.7)
Mean Plasma Glucose: 154 mg/dL
eAG (mmol/L): 8.5 mmol/L

## 2023-08-27 LAB — TSH: TSH: 0.86 m[IU]/L (ref 0.40–4.50)

## 2023-09-01 ENCOUNTER — Non-Acute Institutional Stay: Payer: MEDICARE | Admitting: Internal Medicine

## 2023-09-01 ENCOUNTER — Encounter: Payer: Self-pay | Admitting: Internal Medicine

## 2023-09-01 VITALS — BP 140/68 | HR 111 | Temp 97.8°F | Resp 17 | Ht 64.0 in | Wt 180.0 lb

## 2023-09-01 DIAGNOSIS — E1169 Type 2 diabetes mellitus with other specified complication: Secondary | ICD-10-CM

## 2023-09-01 DIAGNOSIS — K591 Functional diarrhea: Secondary | ICD-10-CM | POA: Diagnosis not present

## 2023-09-01 DIAGNOSIS — G8929 Other chronic pain: Secondary | ICD-10-CM

## 2023-09-01 DIAGNOSIS — D509 Iron deficiency anemia, unspecified: Secondary | ICD-10-CM

## 2023-09-01 DIAGNOSIS — E039 Hypothyroidism, unspecified: Secondary | ICD-10-CM | POA: Diagnosis not present

## 2023-09-01 DIAGNOSIS — F5101 Primary insomnia: Secondary | ICD-10-CM

## 2023-09-01 DIAGNOSIS — Z96611 Presence of right artificial shoulder joint: Secondary | ICD-10-CM

## 2023-09-01 MED ORDER — TRAZODONE HCL 50 MG PO TABS: 25.0000 mg | ORAL_TABLET | Freq: Every day | ORAL | 3 refills | Status: DC

## 2023-09-01 MED ORDER — SEMAGLUTIDE(0.25 OR 0.5MG/DOS) 2 MG/3ML ~~LOC~~ SOPN: 0.2500 mg | PEN_INJECTOR | SUBCUTANEOUS | 4 refills | Status: DC

## 2023-09-02 ENCOUNTER — Encounter: Payer: Self-pay | Admitting: Podiatry

## 2023-09-02 ENCOUNTER — Ambulatory Visit (INDEPENDENT_AMBULATORY_CARE_PROVIDER_SITE_OTHER): Payer: MEDICARE | Admitting: Podiatry

## 2023-09-02 DIAGNOSIS — E119 Type 2 diabetes mellitus without complications: Secondary | ICD-10-CM | POA: Diagnosis not present

## 2023-09-02 DIAGNOSIS — B351 Tinea unguium: Secondary | ICD-10-CM

## 2023-09-02 DIAGNOSIS — M79675 Pain in left toe(s): Secondary | ICD-10-CM | POA: Diagnosis not present

## 2023-09-02 DIAGNOSIS — M79674 Pain in right toe(s): Secondary | ICD-10-CM

## 2023-09-02 NOTE — Progress Notes (Signed)
This patient returns to my office for at risk foot care.  This patient requires this care by a professional since this patient will be at risk due to having diabetes with no complications.  This patient is unable to cut nails herself since the patient cannot reach her nails.These nails are painful walking and wearing shoes.  This patient presents for at risk foot care today.  General Appearance  Alert, conversant and in no acute stress.  Vascular  Dorsalis pedis and posterior tibial  pulses are palpable  bilaterally.  Capillary return is within normal limits  bilaterally. Temperature is within normal limits  bilaterally.  Neurologic  Senn-Weinstein monofilament wire test within normal limits  bilaterally. Muscle power within normal limits bilaterally.  Nails Thick disfigured discolored nails with subungual debris  from hallux to fifth toes bilaterally. No evidence of bacterial infection or drainage bilaterally.  Orthopedic  No limitations of motion  feet .  No crepitus or effusions noted.  No bony pathology or digital deformities noted. Midfoot DJD. Plantar positioning of spur left foot.  Skin  normotropic skin with no porokeratosis noted bilaterally.  No signs of infections or ulcers noted.     Onychomycosis  Pain in right toes  Pain in left toes  Consent was obtained for treatment procedures.   Mechanical debridement of nails 1-5  bilaterally performed with a nail nipper.  Filed with dremel without incident.    Return office visit    3  months                  Told patient to return for periodic foot care and evaluation due to potential at risk complications.   Helane Gunther DPM

## 2023-09-03 ENCOUNTER — Other Ambulatory Visit: Payer: Self-pay | Admitting: Orthopedic Surgery

## 2023-09-03 NOTE — Telephone Encounter (Signed)
High Risk Warning Populated when attempting to refill, I will send to Provider for further review 

## 2023-09-05 NOTE — Progress Notes (Signed)
Location:  [facility]  [facility] (12)  Provider:   Code Status:  Goals of Care:     09/01/2023    9:24 AM  Advanced Directives  Does Patient Have a Medical Advance Directive? Yes  Type of Estate agent of [place];Living will  Does patient want to make changes to medical advance directive? No - Patient declined  Copy of Healthcare Power of Attorney in Chart? No - copy requested     Chief Complaint  Patient presents with   Medical Management of Chronic Issues    Patient is being seen for a follow up and discuss medications    HPI: Patient is a 82 y.o. female seen today for medical management of chronic diseases.   [place]   Has history of type 2 diabetes, iron deficiency anemia, hypothyroidism, chronic arthritis, hyperlipidemia     Now s/p Right Shoulder Arthroplasty on 07/30/22 Doing well Still Does not have Complete Movement of her Shoulder No Pain Meds Anymore. Not taking Tramadol anymore  Left Shoulder Pain/ Restrictive movement Needs Arthroplasty wants to wait right now Diabetes She is on Metformin but having severe diarrhea and wants to stop  She wants to know if she can Do Ozempic instead  She also has Iron def Anemia She had Colonoscopy few years ago and it was completely normal per patient and Notes from her Previous PCP Hgb doing well on Iron Using Trazodone Every night and it seems to be working well   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   Hypertension    Hypothyroidism     Past Surgical History:  Procedure Laterality Date   CATARACT EXTRACTION Bilateral 2016   CHOLECYSTECTOMY  1983   COLONOSCOPY  2018   Octavia Bruckner, MD/Per new patient packet   DG  PORTABLE CHEST X RAY ([facility] HX)  2023   Shoulder/Per new patient packet   DIAGNOSTIC MAMMOGRAM  2022   Per new patient packet   REVERSE SHOULDER ARTHROPLASTY Right 07/30/2022   Procedure: REVERSE SHOULDER ARTHROPLASTY;  Surgeon: Francena Hanly, MD;  Location: [facility];  Service: Orthopedics;  Laterality: Right;    TOTAL KNEE ARTHROPLASTY Left 2017   TOTAL SHOULDER ARTHROPLASTY  07/30/2022    Allergies  Allergen Reactions   Celecoxib Diarrhea    Other reaction(s): other   Quinidine Diarrhea   Codeine Rash    Outpatient Encounter Medications as of 09/01/2023  Medication Sig   acetaminophen (TYLENOL) 500 MG tablet Take 1,000 mg by mouth as needed.   amoxicillin (AMOXIL) 500 MG tablet Take 500 mg by mouth. Dental Prophlaxis   Calcium Carbonate-Vitamin D 600-5 MG-MCG TABS Take 1 tablet by mouth daily.   colestipol (COLESTID) 1 g tablet TAKE 1 TABLET BY MOUTH DAILY. (Patient taking differently: Take 1 g by mouth daily as needed.)   famotidine (PEPCID AC MAXIMUM STRENGTH) 20 MG tablet Take 20 mg by mouth daily.   fluticasone (FLONASE) 50 MCG/ACT nasal spray Place 1 spray into both nostrils daily as needed for allergies.   Iron Combinations (IRON COMPLEX PO) Take by mouth.   levothyroxine (SYNTHROID) 100 MCG tablet TAKE 1 TABLET BY MOUTH EVERY DAY   Menthol, Topical  Analgesic, (BIOFREEZE) 10 % CREA Apply 1 Application topically at bedtime.   oxybutynin (DITROPAN-XL) 5 MG 24 hr tablet Take 1 tablet (5 mg total) by mouth at bedtime.   rosuvastatin (CRESTOR) 20 MG tablet TAKE 1 TABLET BY MOUTH EVERYDAY AT BEDTIME   Semaglutide,0.25 or 0.5MG /DOS, 2 MG/3ML SOPN Inject 0.25 mg into the skin once a week.   sitaGLIPtin (JANUVIA) 100 MG tablet Take 1 tablet (100 mg total) by mouth daily.   [DISCONTINUED] losartan-hydrochlorothiazide (HYZAAR) 100-25 MG tablet Take 1 tablet by mouth every morning.   [DISCONTINUED] metFORMIN (GLUCOPHAGE) 850 MG tablet TAKE 1 TABLET BY MOUTH 3 TIMES DAILY. (Patient  taking differently: Take 500 mg by mouth 2 (two) times daily with a meal.)   [DISCONTINUED] traMADol (ULTRAM) 50 MG tablet Take 1 tablet (50 mg total) by mouth daily.   [DISCONTINUED] traZODone (DESYREL) 50 MG tablet TAKE 1/2 TO 1 TABLET AT BEDTIME AS NEEDED FOR SLEEP *START WITH 1/2 MAY INCREASE TO 1 FULL TABLET*   traZODone (DESYREL) 50 MG tablet Take 0.5 tablets (25 mg total) by mouth at bedtime.   [DISCONTINUED] naproxen (NAPROSYN) 500 MG tablet Take 500 mg by mouth 2 (two) times daily with a meal. (Patient not taking: Reported on 09/01/2023)   No facility-administered encounter medications on file as of 09/01/2023.    Review of Systems:  Review of Systems  Constitutional:  Negative for activity change and appetite change.  HENT: Negative.    Respiratory:  Negative for cough and shortness of breath.   Cardiovascular:  Negative for leg swelling.  Gastrointestinal:  Positive for diarrhea. Negative for constipation.  Genitourinary: Negative.   Musculoskeletal:  Positive for arthralgias. Negative for gait problem and myalgias.  Skin: Negative.   Neurological:  Negative for dizziness and weakness.  Psychiatric/Behavioral:  Negative for confusion, dysphoric mood and sleep disturbance.     Health Maintenance  Topic Date Due   Diabetic kidney evaluation - Urine ACR  Never done   Zoster Vaccines- Shingrix (1 of 2) 08/01/1993   FOOT EXAM  02/11/2023   COVID-19 Vaccine (7 - 2023-24 season) 06/27/2023   Pneumonia Vaccine 60+ Years old (1 of 1 - PCV) 11/05/2023 (Originally 08/01/2008)   DEXA SCAN  02/03/2024 (Originally 08/01/2008)   HEMOGLOBIN A1C  02/23/2024   Medicare Annual Wellness (AWV)  03/04/2024   OPHTHALMOLOGY EXAM  04/25/2024   Diabetic kidney evaluation - eGFR measurement  08/25/2024   DTaP/Tdap/Td (3 - Td or Tdap) 05/23/2031   INFLUENZA VACCINE  Completed   HPV VACCINES  Aged Out    Physical Exam: Vitals:   09/01/23 0935  BP: (!) 140/68  Pulse: (!) 111  Resp: 17  Temp:  97.8 F (36.6 C)  TempSrc: Temporal  SpO2: 97%  Weight: 180 lb (81.6 kg)  Height: 5\' 4"  (1.626 m)   Body mass index is 30.9 kg/m. Physical Exam Vitals reviewed.  Constitutional:      Appearance: Normal appearance.  HENT:     Head: Normocephalic.     Nose: Nose normal.     Mouth/Throat:     Mouth: Mucous membranes are moist.     Pharynx: Oropharynx is clear.  Eyes:     Pupils: Pupils are equal, round, and reactive to light.  Cardiovascular:     Rate and Rhythm: Normal rate and regular rhythm.     Pulses: Normal pulses.     Heart sounds: Normal heart sounds. No murmur heard. Pulmonary:     Effort: Pulmonary effort is normal.  Breath sounds: Normal breath sounds.  Abdominal:     General: Abdomen is flat. Bowel sounds are normal.     Palpations: Abdomen is soft.  Musculoskeletal:        General: No swelling.     Cervical back: Neck supple.  Skin:    General: Skin is warm.  Neurological:     General: No focal deficit present.     Mental Status: She is alert and oriented to person, place, and time.  Psychiatric:        Mood and Affect: Mood normal.        Thought Content: Thought content normal.     Labs reviewed: Basic Metabolic Panel: Recent Labs    10/29/22 0810 08/26/23 0800  NA 140 140  K 4.1 4.0  CL 101 100  CO2 30 30  GLUCOSE 117* 130*  BUN 17 14  CREATININE 0.80 0.87  CALCIUM 9.9 9.9  TSH 1.54 0.86   Liver Function Tests: Recent Labs    10/29/22 0810 08/26/23 0800  AST 16 17  ALT 12 15  BILITOT 0.5 0.5  PROT 6.9 6.9   No results for input(s): "LIPASE", "AMYLASE" in the last 8760 hours. No results for input(s): "AMMONIA" in the last 8760 hours. CBC: Recent Labs    10/29/22 0810 01/28/23 0820 08/26/23 0800  WBC 6.1 6.9 7.0  NEUTROABS 3,428 3,623 3,948  HGB 11.0* 10.5* 11.5*  HCT 34.5* 33.6* 36.3  MCV 79.3* 79.1* 80.1  PLT 302 318 319   Lipid Panel: Recent Labs    10/29/22 0810 08/26/23 0800  CHOL 108 104  HDL 48* 41*   LDLCALC 39 40  TRIG 126 156*  CHOLHDL 2.3 2.5   Lab Results  Component Value Date   HGBA1C 7.0 (H) 08/26/2023    Procedures since last visit: No results found.  Assessment/Plan 1. Type 2 diabetes mellitus with other specified complication, without long-term current use of insulin (HCC) Will try Ozempic Metformin stopped Also will eventually stop Januvia And Will try Low dose of Metformin Side effects discussed  2. 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 Iron deficiency anemia, unspecified iron deficiency anemia type Hgb better on Iron Previous Work up is negative  4. Acquired hypothyroidism TSH normal   5. History of arthroplasty of right shoulder Continue Self Therapy Pain Controlled now  6. Other chronic pain Not using Tramadol anymore  7. 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

## 2023-09-07 ENCOUNTER — Ambulatory Visit: Payer: MEDICARE

## 2023-09-09 ENCOUNTER — Ambulatory Visit
Admission: RE | Admit: 2023-09-09 | Discharge: 2023-09-09 | Disposition: A | Discharge status: Home / Self Care | Payer: MEDICARE | Source: Ambulatory Visit | Attending: Internal Medicine | Admitting: Internal Medicine

## 2023-09-09 DIAGNOSIS — Z1231 Encounter for screening mammogram for malignant neoplasm of breast: Secondary | ICD-10-CM

## 2023-09-16 ENCOUNTER — Other Ambulatory Visit: Payer: Self-pay | Admitting: Internal Medicine

## 2023-09-16 DIAGNOSIS — E039 Hypothyroidism, unspecified: Secondary | ICD-10-CM

## 2023-09-16 DIAGNOSIS — Z0000 Encounter for general adult medical examination without abnormal findings: Secondary | ICD-10-CM

## 2023-09-24 ENCOUNTER — Other Ambulatory Visit: Payer: Self-pay | Admitting: Internal Medicine

## 2023-09-27 NOTE — Telephone Encounter (Signed)
Pharmacy comment: REQUEST FOR 90 DAYS PRESCRIPTION.

## 2023-10-06 ENCOUNTER — Non-Acute Institutional Stay: Payer: MEDICARE | Admitting: Internal Medicine

## 2023-10-06 ENCOUNTER — Encounter: Payer: Self-pay | Admitting: Internal Medicine

## 2023-10-06 VITALS — BP 132/86 | HR 103 | Temp 98.3°F | Resp 16 | Ht 64.0 in | Wt 175.6 lb

## 2023-10-06 DIAGNOSIS — E039 Hypothyroidism, unspecified: Secondary | ICD-10-CM | POA: Diagnosis not present

## 2023-10-06 DIAGNOSIS — K591 Functional diarrhea: Secondary | ICD-10-CM | POA: Diagnosis not present

## 2023-10-06 DIAGNOSIS — G8929 Other chronic pain: Secondary | ICD-10-CM

## 2023-10-06 DIAGNOSIS — Z96611 Presence of right artificial shoulder joint: Secondary | ICD-10-CM

## 2023-10-06 DIAGNOSIS — E538 Deficiency of other specified B group vitamins: Secondary | ICD-10-CM

## 2023-10-06 DIAGNOSIS — E1169 Type 2 diabetes mellitus with other specified complication: Secondary | ICD-10-CM | POA: Diagnosis not present

## 2023-10-06 DIAGNOSIS — D509 Iron deficiency anemia, unspecified: Secondary | ICD-10-CM

## 2023-10-06 DIAGNOSIS — F5101 Primary insomnia: Secondary | ICD-10-CM

## 2023-10-06 MED ORDER — SEMAGLUTIDE(0.25 OR 0.5MG/DOS) 2 MG/3ML ~~LOC~~ SOPN: 0.5000 mg | PEN_INJECTOR | SUBCUTANEOUS | 4 refills | Status: DC

## 2023-10-06 NOTE — Progress Notes (Signed)
[facility] Service:  Clinic (12)  Provider:   Code Status:  Goals of Care:     10/06/2023   10:27 AM  Advanced Directives  Does Patient Have a Medical Advance Directive? Yes  Type of Estate agent of [place];Living will  Does patient want to make changes to medical advance directive? No - Patient declined  Copy of Healthcare Power of Attorney in Chart? No - copy requested     Chief Complaint  Patient presents with   Medical Management of Chronic Issues    Patient is being seen for a follow up     HPI: Patient is a 82 y.o. female seen today for an acute visit for follow-up of her Ozempic  [place] in [place]   Has history of type 2 diabetes, iron deficiency anemia, hypothyroidism, chronic arthritis, hyperlipidemia    patient was taken off metformin due to diarrhea and started Ozempic.  Patient has lost 5 pounds in 5 weeks.  Denies any nausea vomiting does states she has been eating less.  Noted diarrhea has completely resolved off metformin. No abdominal pain CBGS running 140-150 now  Her other history  s/p Right Shoulder Arthroplasty on 07/30/22 Doing well Still Does not have Complete Movement of her Shoulder No Pain Meds Anymore. Not taking Tramadol anymore Left Shoulder Pain/ Restrictive movement Needs Arthroplasty wants to wait right now also has Iron def Anemia She had Colonoscopy few years ago and it was completely normal per patient and Notes from her Previous PCP Hgb doing well on Iron Using Trazodone Every night and it seems to be working well  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   Hypertension    Hypothyroidism     Past Surgical History:  Procedure Laterality Date    CATARACT EXTRACTION Bilateral 2016   CHOLECYSTECTOMY  1983   COLONOSCOPY  2018   Octavia Bruckner, MD/Per new patient packet   DG PORTABLE CHEST X RAY ([facility] HX)  2023   Shoulder/Per new patient packet   DIAGNOSTIC MAMMOGRAM  2022   Per new patient packet   REVERSE SHOULDER ARTHROPLASTY Right 07/30/2022   Procedure: REVERSE SHOULDER ARTHROPLASTY;  Surgeon: Francena Hanly, MD;  Location: [facility];  Service: Orthopedics;  Laterality: Right;    TOTAL KNEE ARTHROPLASTY Left 2017   TOTAL SHOULDER ARTHROPLASTY  07/30/2022    Allergies  Allergen Reactions   Celecoxib Diarrhea    Other reaction(s): other   Quinidine Diarrhea   Codeine Rash    Outpatient Encounter Medications as of 10/06/2023  Medication Sig   acetaminophen (TYLENOL) 500 MG tablet Take 1,000 mg by mouth as needed.   amoxicillin (AMOXIL) 500 MG tablet Take 500 mg by mouth. Dental Prophlaxis   Calcium Carbonate-Vitamin D 600-5 MG-MCG TABS Take 1 tablet by mouth daily.   colestipol (COLESTID) 1 g tablet TAKE 1 TABLET BY MOUTH DAILY. (Patient taking differently: Take 1 g by mouth daily as needed.)   famotidine (PEPCID AC MAXIMUM STRENGTH) 20 MG tablet Take 20 mg by mouth daily.   fluticasone (FLONASE) 50 MCG/ACT nasal spray Place 1 spray into both nostrils daily as needed for allergies.   Iron Combinations (IRON COMPLEX PO) Take  by mouth.   levothyroxine (SYNTHROID) 100 MCG tablet TAKE 1 TABLET BY MOUTH EVERY DAY   losartan-hydrochlorothiazide (HYZAAR) 100-25 MG tablet TAKE 1 TABLET BY MOUTH IN THE MORNING   Menthol, Topical Analgesic, (BIOFREEZE) 10 % CREA Apply 1 Application topically at bedtime.   oxybutynin (DITROPAN-XL) 5 MG 24 hr tablet Take 1 tablet (5 mg total) by mouth at bedtime.   rosuvastatin (CRESTOR) 20 MG tablet TAKE 1 TABLET BY MOUTH EVERYDAY AT BEDTIME   traZODone (DESYREL) 50 MG tablet TAKE 0.5 TABLETS BY MOUTH AT BEDTIME.   [DISCONTINUED] Semaglutide,0.25 or 0.5MG /DOS, 2 MG/3ML SOPN Inject 0.25 mg into  the skin once a week.   [DISCONTINUED] sitaGLIPtin (JANUVIA) 100 MG tablet Take 1 tablet (100 mg total) by mouth daily.   Semaglutide,0.25 or 0.5MG /DOS, 2 MG/3ML SOPN Inject 0.5 mg into the skin once a week.   No facility-administered encounter medications on file as of 10/06/2023.    Review of Systems:  Review of Systems  Constitutional:  Negative for activity change and appetite change.  HENT: Negative.    Respiratory:  Negative for cough and shortness of breath.   Cardiovascular:  Negative for leg swelling.  Gastrointestinal:  Negative for constipation.  Genitourinary: Negative.   Musculoskeletal:  Positive for arthralgias, gait problem and myalgias.  Skin: Negative.   Neurological:  Negative for dizziness and weakness.  Psychiatric/Behavioral:  Negative for confusion, dysphoric mood and sleep disturbance.     Health Maintenance  Topic Date Due   Diabetic kidney evaluation - Urine ACR  Never done   Zoster Vaccines- Shingrix (1 of 2) 08/01/1993   FOOT EXAM  02/11/2023   Pneumonia Vaccine 42+ Years old (1 of 2 - PCV) 11/05/2023 (Originally 08/01/1947)   DEXA SCAN  02/03/2024 (Originally 08/01/2008)   COVID-19 Vaccine (8 - 2023-24 season) 01/01/2024   HEMOGLOBIN A1C  02/23/2024   OPHTHALMOLOGY EXAM  04/25/2024   Diabetic kidney evaluation - eGFR measurement  08/25/2024   Medicare Annual Wellness (AWV)  09/15/2024   DTaP/Tdap/Td (3 - Td or Tdap) 05/23/2031   INFLUENZA VACCINE  Completed   HPV VACCINES  Aged Out    Physical Exam: Vitals:   10/06/23 1024  Pulse: (!) 103  Resp: 16  Temp: 98.3 F (36.8 C)  TempSrc: Temporal  SpO2: 95%  Weight: 175 lb 9.6 oz (79.7 kg)  Height: 5\' 4"  (1.626 m)   Body mass index is 30.14 kg/m. Physical Exam Vitals reviewed.  Constitutional:      Appearance: Normal appearance.  HENT:     Head: Normocephalic.     Nose: Nose normal.     Mouth/Throat:     Mouth: Mucous membranes are moist.     Pharynx: Oropharynx is clear.  Eyes:      Pupils: Pupils are equal, round, and reactive to light.  Cardiovascular:     Rate and Rhythm: Normal rate and regular rhythm.     Pulses: Normal pulses.     Heart sounds: Normal heart sounds. No murmur heard. Pulmonary:     Effort: Pulmonary effort is normal.     Breath sounds: Normal breath sounds.  Abdominal:     General: Abdomen is flat. Bowel sounds are normal.     Palpations: Abdomen is soft.  Musculoskeletal:        General: No swelling.     Cervical back: Neck supple.  Skin:    General: Skin is warm.  Neurological:     General: No focal deficit present.  Mental Status: She is alert and oriented to person, place, and time.  Psychiatric:        Mood and Affect: Mood normal.        Thought Content: Thought content normal.     Labs reviewed: Basic Metabolic Panel: Recent Labs    10/29/22 0810 08/26/23 0800  NA 140 140  K 4.1 4.0  CL 101 100  CO2 30 30  GLUCOSE 117* 130*  BUN 17 14  CREATININE 0.80 0.87  CALCIUM 9.9 9.9  TSH 1.54 0.86   Liver Function Tests: Recent Labs    10/29/22 0810 08/26/23 0800  AST 16 17  ALT 12 15  BILITOT 0.5 0.5  PROT 6.9 6.9   No results for input(s): "LIPASE", "AMYLASE" in the last 8760 hours. No results for input(s): "AMMONIA" in the last 8760 hours. CBC: Recent Labs    10/29/22 0810 01/28/23 0820 08/26/23 0800  WBC 6.1 6.9 7.0  NEUTROABS 3,428 3,623 3,948  HGB 11.0* 10.5* 11.5*  HCT 34.5* 33.6* 36.3  MCV 79.3* 79.1* 80.1  PLT 302 318 319   Lipid Panel: Recent Labs    10/29/22 0810 08/26/23 0800  CHOL 108 104  HDL 48* 41*  LDLCALC 39 40  TRIG 126 156*  CHOLHDL 2.3 2.5   Lab Results  Component Value Date   HGBA1C 7.0 (H) 08/26/2023    Procedures since last visit: MM 3D SCREENING MAMMOGRAM BILATERAL BREAST  Result Date: 09/13/2023 CLINICAL DATA:  Screening. EXAM: DIGITAL SCREENING BILATERAL MAMMOGRAM WITH TOMOSYNTHESIS AND CAD TECHNIQUE: Bilateral screening digital craniocaudal and mediolateral  oblique mammograms were obtained. Bilateral screening digital breast tomosynthesis was performed. The images were evaluated with computer-aided detection. COMPARISON:  Previous exam(s). ACR Breast Density Category b: There are scattered areas of fibroglandular density. FINDINGS: There are no findings suspicious for malignancy. IMPRESSION: No mammographic evidence of malignancy. A result letter of this screening mammogram will be mailed directly to the patient. RECOMMENDATION: Screening mammogram in one year. (Code:SM-B-01Y) BI-RADS CATEGORY  1: Negative. Electronically Signed   By: Baird Lyons M.D.   On: 09/13/2023 13:05    Assessment/Plan 1. Type 2 diabetes mellitus with other specified complication, without long-term current use of insulin (HCC) Stop Januvia and Metformin Now only on ozempic Dose changed to 0.5mg  Reassess in 8 weeks  2. Functional diarrhea Resolved after Metformin stopped  3. Iron deficiency anemia, unspecified iron deficiency anemia type On Iron Previous Work up is negative  4. Acquired hypothyroidism TSH normal in 10/24  5. History of arthroplasty of right shoulder Doing well  6. Other chronic pain Due to arthritis  7. Primary insomnia Trazodone  8. Vitamin B12 deficiency Supplement 9 Urinary Incontinence On Ditropan   10 HTN Continue Hyzaar 11 HLD statin   Labs/tests ordered:  * No order type specified * Next appt:  12/15/2023

## 2023-10-20 ENCOUNTER — Other Ambulatory Visit: Payer: Self-pay | Admitting: Orthopedic Surgery

## 2023-12-06 ENCOUNTER — Encounter: Payer: Self-pay | Admitting: Podiatry

## 2023-12-06 ENCOUNTER — Ambulatory Visit (INDEPENDENT_AMBULATORY_CARE_PROVIDER_SITE_OTHER): Payer: MEDICARE | Admitting: Podiatry

## 2023-12-06 DIAGNOSIS — B351 Tinea unguium: Secondary | ICD-10-CM | POA: Diagnosis not present

## 2023-12-06 DIAGNOSIS — M79674 Pain in right toe(s): Secondary | ICD-10-CM | POA: Diagnosis not present

## 2023-12-06 DIAGNOSIS — E119 Type 2 diabetes mellitus without complications: Secondary | ICD-10-CM

## 2023-12-06 DIAGNOSIS — M79675 Pain in left toe(s): Secondary | ICD-10-CM | POA: Diagnosis not present

## 2023-12-06 NOTE — Progress Notes (Signed)
 This patient returns to my office for at risk foot care.  This patient requires this care by a professional since this patient will be at risk due to having diabetes with no complications.  This patient is unable to cut nails herself since the patient cannot reach her nails.These nails are painful walking and wearing shoes.  This patient presents for at risk foot care today.  General Appearance  Alert, conversant and in no acute stress.  Vascular  Dorsalis pedis and posterior tibial  pulses are palpable  bilaterally.  Capillary return is within normal limits  bilaterally. Temperature is within normal limits  bilaterally.  Neurologic  Senn-Weinstein monofilament wire test within normal limits  bilaterally. Muscle power within normal limits bilaterally.  Nails Thick disfigured discolored nails with subungual debris  from hallux to fifth toes bilaterally. No evidence of bacterial infection or drainage bilaterally.  Orthopedic  No limitations of motion  feet .  No crepitus or effusions noted.  No bony pathology or digital deformities noted. Midfoot DJD. Plantar positioning of spur left foot.  Skin  normotropic skin with no porokeratosis noted bilaterally.  No signs of infections or ulcers noted.     Onychomycosis  Pain in right toes  Pain in left toes  Consent was obtained for treatment procedures.   Mechanical debridement of nails 1-5  bilaterally performed with a nail nipper.  Filed with dremel without incident.    Return office visit    3  months                  Told patient to return for periodic foot care and evaluation due to potential at risk complications.   Helane Gunther DPM

## 2023-12-15 ENCOUNTER — Encounter: Payer: MEDICARE | Admitting: Internal Medicine

## 2023-12-22 ENCOUNTER — Non-Acute Institutional Stay: Payer: MEDICARE | Admitting: Internal Medicine

## 2023-12-22 VITALS — BP 128/72 | HR 101 | Temp 98.5°F | Resp 17 | Ht 64.0 in | Wt 176.3 lb

## 2023-12-22 DIAGNOSIS — D509 Iron deficiency anemia, unspecified: Secondary | ICD-10-CM

## 2023-12-22 DIAGNOSIS — E039 Hypothyroidism, unspecified: Secondary | ICD-10-CM

## 2023-12-22 DIAGNOSIS — E1169 Type 2 diabetes mellitus with other specified complication: Secondary | ICD-10-CM | POA: Diagnosis not present

## 2023-12-22 DIAGNOSIS — K591 Functional diarrhea: Secondary | ICD-10-CM

## 2023-12-22 DIAGNOSIS — E538 Deficiency of other specified B group vitamins: Secondary | ICD-10-CM

## 2023-12-22 DIAGNOSIS — F5101 Primary insomnia: Secondary | ICD-10-CM

## 2023-12-22 MED ORDER — SEMAGLUTIDE (1 MG/DOSE) 4 MG/3ML ~~LOC~~ SOPN: 1.0000 mg | PEN_INJECTOR | SUBCUTANEOUS | 3 refills | Status: DC

## 2023-12-22 NOTE — Progress Notes (Signed)
 Location:  [facility]  Clinic (12)  Provider:   Code Status:  Goals of Care:     12/22/2023    1:28 PM  Advanced Directives  Does Patient Have a Medical Advance Directive? Yes  Type of Estate agent of [place];Living will  Copy of Healthcare Power of Attorney in Chart? No - copy requested     Chief Complaint  Patient presents with   Medical Management of Chronic Issues    Patient is being seen for a follow up    HPI: Patient is a 83 y.o. female seen today for medical management of chronic diseases.   [place]   Has history of type 2 diabetes, iron deficiency anemia, hypothyroidism, chronic arthritis, hyperlipidemia    patient was taken off metformin due to diarrhea and started Ozempic. Doing well with No side effects  Discussed the use of AI scribe software for clinical note transcription with the patient, who gave verbal consent to proceed.  History of Present Illness   The patient, with a history of diabetes and arthritis, presents with follow up about her weight and blood sugar levels. She reports that her current medication, Ozempic, suppresses her appetite for about four hours, after which she feels hungry. She also mentions that her blood sugar levels range from 135 to 165. The patient is trying to lose weight and has been walking more, despite the pain from arthritis in her foot. She also reports some weakness and pain in her left shoulder, which limits her ability to perform certain tasks. The patient also mentions having a hiatal hernia and experiencing occasional abdominal pain. She also reports having had a bladder infection around Christmas, which was treated successfully. The patient is also taking gummies for fiber and an iron pill with vitamin C. She reports no significant side effects from her current medication, except for occasional pins and needles sensation at the injection site.       Other history  s/p Right Shoulder Arthroplasty on 07/30/22 Doing well Still Does not have Complete Movement of her Shoulder No Pain Meds Anymore. Not taking Tramadol anymore Left Shoulder Pain/ Restrictive movement Needs Arthroplasty wants to wait right now also has Iron def Anemia She had Colonoscopy few years ago and it was completely normal per patient and Notes from her Previous PCP Hgb doing well on Iron Using Trazodone Every night and it seems to be working well   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   Hypertension    Hypothyroidism     Past Surgical History:  Procedure Laterality Date   CATARACT EXTRACTION Bilateral 2016   CHOLECYSTECTOMY  1983   COLONOSCOPY  2018   Octavia Bruckner, MD/Per new patient packet   DG PORTABLE CHEST X RAY ([facility] HX)  2023   Shoulder/Per new patient packet   DIAGNOSTIC MAMMOGRAM  2022   Per new patient packet   REVERSE SHOULDER ARTHROPLASTY Right 07/30/2022   Procedure: REVERSE SHOULDER ARTHROPLASTY;  Surgeon: Francena Hanly, MD;  Location: [facility];  Service: Orthopedics;  Laterality: Right;    TOTAL KNEE ARTHROPLASTY Left 2017   TOTAL SHOULDER ARTHROPLASTY  07/30/2022    Allergies  Allergen  Reactions   Celecoxib Diarrhea    Other reaction(s): other   Quinidine Diarrhea   Codeine Rash    Outpatient Encounter Medications as of 12/22/2023  Medication Sig   acetaminophen (TYLENOL) 500 MG tablet Take 1,000 mg by mouth as needed.   amoxicillin (AMOXIL) 500 MG tablet Take 500 mg by mouth. Dental Prophlaxis   Calcium Carbonate-Vitamin D 600-5 MG-MCG TABS Take 1 tablet by mouth daily.   colestipol (COLESTID) 1 g tablet TAKE 1 TABLET BY MOUTH DAILY. (Patient taking differently: Take 1 g by mouth daily as needed.)    famotidine (PEPCID AC MAXIMUM STRENGTH) 20 MG tablet Take 20 mg by mouth daily.   fluticasone (FLONASE) 50 MCG/ACT nasal spray Place 1 spray into both nostrils daily as needed for allergies.   Iron Combinations (IRON COMPLEX PO) Take by mouth.   levothyroxine (SYNTHROID) 100 MCG tablet TAKE 1 TABLET BY MOUTH EVERY DAY   losartan-hydrochlorothiazide (HYZAAR) 100-25 MG tablet TAKE 1 TABLET BY MOUTH IN THE MORNING   Menthol, Topical Analgesic, (BIOFREEZE) 10 % CREA Apply 1 Application topically at bedtime.   oxybutynin (DITROPAN-XL) 5 MG 24 hr tablet Take 1 tablet (5 mg total) by mouth at bedtime.   rosuvastatin (CRESTOR) 20 MG tablet TAKE 1 TABLET BY MOUTH EVERYDAY AT BEDTIME   Semaglutide, 1 MG/DOSE, 4 MG/3ML SOPN Inject 1 mg as directed once a week.   traZODone (DESYREL) 50 MG tablet TAKE 0.5 TABLETS BY MOUTH AT BEDTIME.   [DISCONTINUED] Semaglutide,0.25 or 0.5MG /DOS, 2 MG/3ML SOPN Inject 0.5 mg into the skin once a week.   No facility-administered encounter medications on file as of 12/22/2023.    Review of Systems:  Review of Systems  Constitutional:  Negative for activity change and appetite change.  HENT: Negative.    Respiratory:  Negative for cough and shortness of breath.   Cardiovascular:  Negative for leg swelling.  Gastrointestinal:  Negative for constipation.  Genitourinary: Negative.   Musculoskeletal:  Positive for arthralgias and myalgias. Negative for gait problem.  Skin: Negative.   Neurological:  Negative for dizziness and weakness.  Psychiatric/Behavioral:  Negative for confusion, dysphoric mood and sleep disturbance.     Health Maintenance  Topic Date Due   Pneumonia Vaccine 77+ Years old (1 of 2 - PCV) Never done   Diabetic kidney evaluation - Urine ACR  Never done   Zoster Vaccines- Shingrix (1 of 2) 08/01/1993   FOOT EXAM  02/11/2023   DEXA SCAN  02/03/2024 (Originally 08/01/2008)   HEMOGLOBIN A1C  02/23/2024   OPHTHALMOLOGY EXAM  04/25/2024   Diabetic kidney  evaluation - eGFR measurement  08/25/2024   Medicare Annual Wellness (AWV)  09/15/2024   DTaP/Tdap/Td (3 - Td or Tdap) 05/23/2031   INFLUENZA VACCINE  Completed   COVID-19 Vaccine  Completed   HPV VACCINES  Aged Out    Physical Exam: Vitals:   12/22/23 1327  BP: 128/72  Pulse: (!) 101  Resp: 17  Temp: 98.5 F (36.9 C)  TempSrc: Temporal  SpO2: 95%  Weight: 176 lb 4.8 oz (80 kg)  Height: 5\' 4"  (1.626 m)   Body mass index is 30.26 kg/m. Physical Exam Vitals reviewed.  Constitutional:      Appearance: Normal appearance.  HENT:     Head: Normocephalic.     Nose: Nose normal.     Mouth/Throat:     Mouth: Mucous membranes are moist.     Pharynx: Oropharynx is clear.  Eyes:     Pupils: Pupils are equal,  round, and reactive to light.  Cardiovascular:     Rate and Rhythm: Normal rate and regular rhythm.     Pulses: Normal pulses.     Heart sounds: Normal heart sounds. No murmur heard. Pulmonary:     Effort: Pulmonary effort is normal.     Breath sounds: Normal breath sounds.  Abdominal:     General: Abdomen is flat. Bowel sounds are normal.     Palpations: Abdomen is soft.  Musculoskeletal:        General: No swelling.     Cervical back: Neck supple.  Skin:    General: Skin is warm.  Neurological:     General: No focal deficit present.     Mental Status: She is alert and oriented to person, place, and time.  Psychiatric:        Mood and Affect: Mood normal.        Thought Content: Thought content normal.     Labs reviewed: Basic Metabolic Panel: Recent Labs    08/26/23 0800  NA 140  K 4.0  CL 100  CO2 30  GLUCOSE 130*  BUN 14  CREATININE 0.87  CALCIUM 9.9  TSH 0.86   Liver Function Tests: Recent Labs    08/26/23 0800  AST 17  ALT 15  BILITOT 0.5  PROT 6.9   No results for input(s): "LIPASE", "AMYLASE" in the last 8760 hours. No results for input(s): "AMMONIA" in the last 8760 hours. CBC: Recent Labs    01/28/23 0820 08/26/23 0800  WBC  6.9 7.0  NEUTROABS 3,623 3,948  HGB 10.5* 11.5*  HCT 33.6* 36.3  MCV 79.1* 80.1  PLT 318 319   Lipid Panel: Recent Labs    08/26/23 0800  CHOL 104  HDL 41*  LDLCALC 40  TRIG 161*  CHOLHDL 2.5   Lab Results  Component Value Date   HGBA1C 7.0 (H) 08/26/2023    Procedures since last visit: No results found.  Assessment/Plan Assessment and Plan    Type 2 Diabetes Mellitus Blood glucose levels ranging from 135-165 mg/dL. Currently on Ozempic 0.5mg  with no significant side effects but no weight loss noted. Patient reports hunger and desire to eat approximately 4 hours after taking medication. -Increase Ozempic to 1mg  daily. -Check blood glucose levels regularly and monitor for hypoglycemia.  Arthritis Pain in foot and shoulder limiting physical activity and contributing to difficulty with weight loss. -Continue current management and encourage as much physical activity as tolerated. Functional diarrhea Resolved after Metformin stopped   Iron deficiency anemia, unspecified iron deficiency anemia type On Iron HGB stable Previous Work up is negative  Acquired hypothyroidism TSH normal in 10/24    History of arthroplasty of right shoulder Doing well     Primary insomnia Trazodone    Vitamin B12 deficiency Supplement Urinary Incontinence On Ditropan   HTN Continue Hyzaar HLD statin  General Health Maintenance -Order bone density test. -Advise patient to receive pneumonia vaccine followed by Shingrix vaccine for shingles prevention. -Schedule mammogram for November. -Order lab tests for next month.         Labs/tests ordered:  * No order type specified * Next appt:  Visit date not found

## 2024-01-25 ENCOUNTER — Other Ambulatory Visit: Payer: Self-pay | Admitting: Internal Medicine

## 2024-02-17 ENCOUNTER — Other Ambulatory Visit: Payer: MEDICARE

## 2024-02-18 LAB — COMPREHENSIVE METABOLIC PANEL WITH GFR
AG Ratio: 1.5 (calc) (ref 1.0–2.5)
ALT: 14 U/L (ref 6–29)
AST: 17 U/L (ref 10–35)
Albumin: 4.1 g/dL (ref 3.6–5.1)
Alkaline phosphatase (APISO): 57 U/L (ref 37–153)
BUN: 18 mg/dL (ref 7–25)
CO2: 28 mmol/L (ref 20–32)
Calcium: 9.7 mg/dL (ref 8.6–10.4)
Chloride: 101 mmol/L (ref 98–110)
Creat: 0.77 mg/dL (ref 0.60–0.95)
Globulin: 2.7 g/dL (ref 1.9–3.7)
Glucose, Bld: 124 mg/dL — ABNORMAL HIGH (ref 65–99)
Potassium: 3.9 mmol/L (ref 3.5–5.3)
Sodium: 137 mmol/L (ref 135–146)
Total Bilirubin: 0.6 mg/dL (ref 0.2–1.2)
Total Protein: 6.8 g/dL (ref 6.1–8.1)
eGFR: 78 mL/min/1.73m2 (ref >=60)

## 2024-02-18 LAB — CBC WITH DIFFERENTIAL/PLATELET
Absolute Lymphocytes: 1983 {cells}/uL (ref 850–3900)
Absolute Monocytes: 618 {cells}/uL (ref 200–950)
Basophils Absolute: 59 {cells}/uL (ref 0–200)
Basophils Relative: 0.9 %
Eosinophils Absolute: 150 {cells}/uL (ref 15–500)
Eosinophils Relative: 2.3 %
HCT: 40.6 % (ref 35.0–45.0)
Hemoglobin: 13.2 g/dL (ref 11.7–15.5)
MCH: 27 pg (ref 27.0–33.0)
MCHC: 32.5 g/dL (ref 32.0–36.0)
MCV: 83 fL (ref 80.0–100.0)
MPV: 10.2 fL (ref 7.5–12.5)
Monocytes Relative: 9.5 %
Neutro Abs: 3692 {cells}/uL (ref 1500–7800)
Neutrophils Relative %: 56.8 %
Platelets: 274 10*3/uL (ref 140–400)
RBC: 4.89 10*6/uL (ref 3.80–5.10)
RDW: 14.1 % (ref 11.0–15.0)
Total Lymphocyte: 30.5 %
WBC: 6.5 10*3/uL (ref 3.8–10.8)

## 2024-02-18 LAB — HEMOGLOBIN A1C
Hgb A1c MFr Bld: 6.8 % — ABNORMAL HIGH (ref <5.7)
Mean Plasma Glucose: 148 mg/dL
eAG (mmol/L): 8.2 mmol/L

## 2024-02-18 LAB — LIPID PANEL
Cholesterol: 128 mg/dL (ref <200)
HDL: 45 mg/dL — ABNORMAL LOW (ref >=50)
LDL Cholesterol (Calc): 61 mg/dL
Non-HDL Cholesterol (Calc): 83 mg/dL (ref <130)
Total CHOL/HDL Ratio: 2.8 (calc) (ref <5.0)
Triglycerides: 132 mg/dL (ref <150)

## 2024-02-18 LAB — TSH: TSH: 0.59 m[IU]/L (ref 0.40–4.50)

## 2024-02-18 LAB — VITAMIN B12: Vitamin B-12: 1892 pg/mL — ABNORMAL HIGH (ref 200–1100)

## 2024-02-23 ENCOUNTER — Encounter: Payer: Self-pay | Admitting: Internal Medicine

## 2024-02-23 ENCOUNTER — Non-Acute Institutional Stay: Payer: MEDICARE | Admitting: Internal Medicine

## 2024-02-23 ENCOUNTER — Other Ambulatory Visit: Payer: Self-pay | Admitting: Internal Medicine

## 2024-02-23 VITALS — BP 125/78 | HR 86 | Temp 96.7°F | Resp 18 | Ht 64.0 in | Wt 174.8 lb

## 2024-02-23 DIAGNOSIS — E1169 Type 2 diabetes mellitus with other specified complication: Secondary | ICD-10-CM

## 2024-02-23 DIAGNOSIS — E2839 Other primary ovarian failure: Secondary | ICD-10-CM

## 2024-02-23 DIAGNOSIS — N390 Urinary tract infection, site not specified: Secondary | ICD-10-CM | POA: Diagnosis not present

## 2024-02-23 DIAGNOSIS — Z1231 Encounter for screening mammogram for malignant neoplasm of breast: Secondary | ICD-10-CM

## 2024-02-23 DIAGNOSIS — E039 Hypothyroidism, unspecified: Secondary | ICD-10-CM

## 2024-02-23 MED ORDER — ESTRADIOL 0.1 MG/GM VA CREA: 1.0000 | TOPICAL_CREAM | VAGINAL | 12 refills | Status: AC

## 2024-02-23 MED ORDER — OZEMPIC (0.25 OR 0.5 MG/DOSE) 2 MG/3ML ~~LOC~~ SOPN: 2.0000 mg | PEN_INJECTOR | SUBCUTANEOUS | 3 refills | Status: DC

## 2024-02-23 NOTE — Patient Instructions (Addendum)
 Cranberry Tablets 500 mg 2 tablets every day PCV 20 for Pneumonia  Labs will be done on September 8th at [facility] at 7:45 am

## 2024-02-23 NOTE — Progress Notes (Signed)
 [facility]:  [facility]  Clinic (12)  Provider:   Code Status:  Goals of Care:     12/22/2023    1:28 PM  Advanced Directives  Does Patient Have a Medical Advance Directive? Yes  Type of Estate agent of [place];Living will  Copy of Healthcare Power of Attorney in Chart? No - copy requested     Chief Complaint  Patient presents with   Medical Management of Chronic Issues    2 month follow up    HPI: Patient is a 83 y.o. female seen today for medical management of chronic diseases.   [place]   Has history of type 2 diabetes, iron deficiency anemia, hypothyroidism, chronic arthritis, hyperlipidemia    patient was taken off metformin  due to diarrhea and started Ozempic .  Doing well  A1C is good limits No Side effects Wants to try Higher dose of Ozempic  to help her Loose weight Fasting CBGS range from 140-160  Recurent UTI Has had another UTI  Patient had 1 UTI during Christmas and now she had another 1 in April which initially got Macrodantin and then they gave her amoxicillin.  [facility]  Other issues  s/p Right Shoulder Arthroplasty on 07/30/22 Doing well Still Does not have Complete Movement of her Shoulder No Pain Meds Anymore. 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   Hypertension    Hypothyroidism     Past Surgical History:  Procedure Laterality Date   CATARACT EXTRACTION  Bilateral 2016   CHOLECYSTECTOMY  1983   COLONOSCOPY  2018   [place], MD/Per new patient packet   DG PORTABLE CHEST X RAY ([facility] HX)  2023   Shoulder/Per new patient packet   DIAGNOSTIC MAMMOGRAM  2022   Per new patient packet   REVERSE SHOULDER ARTHROPLASTY Right 07/30/2022   Procedure: REVERSE SHOULDER ARTHROPLASTY;  Surgeon: Ellard Gunning, MD;  Location: [facility];  Service: Orthopedics;  Laterality: Right;    TOTAL KNEE ARTHROPLASTY Left 2017   TOTAL SHOULDER ARTHROPLASTY  07/30/2022    Allergies  Allergen Reactions   Celecoxib Diarrhea    Other reaction(s): other   Quinidine Diarrhea   Codeine Rash    Outpatient Encounter Medications as of 02/23/2024  Medication Sig   acetaminophen  (TYLENOL ) 500 MG tablet Take 1,000 mg by mouth as needed.   amoxicillin (AMOXIL) 500 MG tablet Take 500 mg by mouth. Dental Prophlaxis   Calcium  Carbonate-Vitamin D 600-5 MG-MCG TABS Take 1 tablet by mouth daily.   famotidine (PEPCID AC MAXIMUM STRENGTH) 20 MG tablet Take 20 mg by mouth daily.   fluticasone (FLONASE) 50 MCG/ACT nasal spray Place 1 spray into both nostrils daily as needed for allergies.   Iron Combinations (IRON COMPLEX PO) Take by mouth.   levothyroxine  (SYNTHROID ) 100 MCG tablet TAKE 1 TABLET BY MOUTH EVERY DAY   losartan -hydrochlorothiazide  (HYZAAR ) 100-25 MG tablet  TAKE 1 TABLET BY MOUTH IN THE MORNING   Menthol, Topical Analgesic, (BIOFREEZE) 10 % CREA Apply 1 Application topically at bedtime.   oxybutynin  (DITROPAN -XL) 5 MG 24 hr tablet TAKE 1 TABLET BY MOUTH EVERYDAY AT BEDTIME   rosuvastatin  (CRESTOR ) 20 MG tablet TAKE 1 TABLET BY MOUTH EVERYDAY AT BEDTIME   Semaglutide , 1 MG/DOSE, 4 MG/3ML SOPN Inject 1 mg as directed once a week.   traZODone  (DESYREL ) 50 MG tablet TAKE 0.5 TABLETS BY MOUTH AT BEDTIME.   colestipol  (COLESTID ) 1 g tablet TAKE 1 TABLET BY MOUTH DAILY. (Patient not taking: Reported on 02/23/2024)   No facility-administered encounter medications  on file as of 02/23/2024.    Review of Systems:  Review of Systems  Constitutional:  Negative for activity change and appetite change.  HENT: Negative.    Respiratory:  Negative for cough and shortness of breath.   Cardiovascular:  Negative for leg swelling.  Gastrointestinal:  Negative for constipation.  Genitourinary:  Positive for frequency and urgency.  Musculoskeletal:  Positive for arthralgias and myalgias. Negative for gait problem.  Skin: Negative.   Neurological:  Negative for dizziness and weakness.  Psychiatric/Behavioral:  Negative for confusion, dysphoric mood and sleep disturbance.     Health Maintenance  Topic Date Due   Diabetic kidney evaluation - Urine ACR  Never done   Pneumonia Vaccine 66+ Years old (1 of 2 - PCV) Never done   Zoster Vaccines- Shingrix (1 of 2) 08/01/1993   DEXA SCAN  Never done   FOOT EXAM  02/11/2023   COVID-19 Vaccine (8 - 2024-25 season) 03/02/2024   OPHTHALMOLOGY EXAM  04/25/2024   INFLUENZA VACCINE  05/26/2024   HEMOGLOBIN A1C  08/18/2024   Medicare Annual Wellness (AWV)  09/15/2024   Diabetic kidney evaluation - eGFR measurement  02/16/2025   DTaP/Tdap/Td (3 - Td or Tdap) 05/23/2031   HPV VACCINES  Aged Out   Meningococcal B Vaccine  Aged Out    Physical Exam: Vitals:   02/23/24 1027  BP: 125/78  Pulse: 86  Resp: 18  Temp: (!) 96.7 F (35.9 C)  TempSrc: Temporal  SpO2: 95%  Weight: 174 lb 12.8 oz (79.3 kg)  Height: 5\' 4"  (1.626 m)   Body mass index is 30 kg/m. Physical Exam Vitals reviewed.  Constitutional:      Appearance: Normal appearance.  HENT:     Head: Normocephalic.     Nose: Nose normal.     Mouth/Throat:     Mouth: Mucous membranes are moist.     Pharynx: Oropharynx is clear.  Eyes:     Pupils: Pupils are equal, round, and reactive to light.  Cardiovascular:     Rate and Rhythm: Normal rate and regular rhythm.     Pulses: Normal pulses.     Heart sounds: Normal heart sounds. No murmur  heard. Pulmonary:     Effort: Pulmonary effort is normal.     Breath sounds: Normal breath sounds.  Abdominal:     General: Abdomen is flat. Bowel sounds are normal.     Palpations: Abdomen is soft.  Musculoskeletal:        General: No swelling.     Cervical back: Neck supple.  Skin:    General: Skin is warm.  Neurological:     General: No focal deficit present.     Mental Status: She is alert and oriented to person, place, and time.  Psychiatric:        Mood and Affect: Mood normal.  Thought Content: Thought content normal.     Labs reviewed: Basic Metabolic Panel: Recent Labs    08/26/23 0800 02/17/24 0816  NA 140 137  K 4.0 3.9  CL 100 101  CO2 30 28  GLUCOSE 130* 124*  BUN 14 18  CREATININE 0.87 0.77  CALCIUM  9.9 9.7  TSH 0.86 0.59   Liver Function Tests: Recent Labs    08/26/23 0800 02/17/24 0816  AST 17 17  ALT 15 14  BILITOT 0.5 0.6  PROT 6.9 6.8   No results for input(s): "LIPASE", "AMYLASE" in the last 8760 hours. No results for input(s): "AMMONIA" in the last 8760 hours. CBC: Recent Labs    08/26/23 0800 02/17/24 0816  WBC 7.0 6.5  NEUTROABS 3,948 3,692  HGB 11.5* 13.2  HCT 36.3 40.6  MCV 80.1 83.0  PLT 319 274   Lipid Panel: Recent Labs    08/26/23 0800 02/17/24 0816  CHOL 104 128  HDL 41* 45*  LDLCALC 40 61  TRIG 156* 132  CHOLHDL 2.5 2.8   Lab Results  Component Value Date   HGBA1C 6.8 (H) 02/17/2024    Procedures since last visit: No results found.  Assessment/Plan 1. Type 2 diabetes mellitus with other specified complication, without long-term current use of insulin (HCC) (Primary) Uptodate on Eye exam Dose of Ozempic  increased at her request for Weight loss  2. Acquired hypothyroidism TSH normal 4/25  3. Recurrent UTI Cranberry and Estradiol  4. Estrogen deficiency  - DG BONE DENSITY (DXA); Future  5 Arthritis Pain in foot and shoulder limiting physical activity and contributing to difficulty with  weight loss.  6 Functional diarrhea Resolved after Metformin  stopped   7 Iron deficiency anemia, unspecified iron deficiency anemia type On Iron HGB sNormal now  Previous Work up is negative    8 Primary insomnia Trazodone  PRN   9  Vitamin B12 deficiency Supplement Level was good 10 Urinary Incontinence On Ditropan   Labs/tests ordered:  * No order type specified * Next appt:  Visit date not found

## 2024-02-23 NOTE — Telephone Encounter (Signed)
 Name from pharmacy: OZEMPIC  0.25-0.5 MG/DOSE PEN        Will file in chart as: OZEMPIC , 0.25 OR 0.5 MG/DOSE, 2 MG/3ML SOPN   Sig: Inject 2 mg into the skin once a week.   Disp: Not specified (Pharmacy requested: 3 each)    Refills: 3   Start: 02/23/2024   Class: Normal   Non-formulary   Last ordered: Today (02/23/2024) by Marguerite Shiley, MD   Last refill: 02/23/2024   Rx #: [id number]   Pharmacy comment: Script Clarification:PLEASE CORRECT THE DOSE RX SAYS 0.25-0.5 INSTEAD OF 2 MG.     To be filled at: [id number] - [place], [place] - [address]    Pharmacy requesting Clarification and a NEW Rx.  Please update and send new script to pharmacy.

## 2024-02-24 ENCOUNTER — Other Ambulatory Visit: Payer: Self-pay | Admitting: Internal Medicine

## 2024-02-24 MED ORDER — SEMAGLUTIDE (2 MG/DOSE) 8 MG/3ML ~~LOC~~ SOPN: 2.0000 mg | PEN_INJECTOR | SUBCUTANEOUS | 6 refills | Status: DC

## 2024-02-25 ENCOUNTER — Other Ambulatory Visit: Payer: Self-pay | Admitting: Internal Medicine

## 2024-02-25 NOTE — Telephone Encounter (Signed)
 Pharmacy requested refill.  Pended Rx and sent to Dr. Chales Abrahams for approval due to HIGH ALERT Warning.

## 2024-03-08 ENCOUNTER — Encounter: Payer: Self-pay | Admitting: Podiatry

## 2024-03-08 ENCOUNTER — Ambulatory Visit (INDEPENDENT_AMBULATORY_CARE_PROVIDER_SITE_OTHER): Payer: MEDICARE | Admitting: Podiatry

## 2024-03-08 DIAGNOSIS — M79675 Pain in left toe(s): Secondary | ICD-10-CM

## 2024-03-08 DIAGNOSIS — M79674 Pain in right toe(s): Secondary | ICD-10-CM

## 2024-03-08 DIAGNOSIS — E119 Type 2 diabetes mellitus without complications: Secondary | ICD-10-CM

## 2024-03-08 DIAGNOSIS — B351 Tinea unguium: Secondary | ICD-10-CM | POA: Diagnosis not present

## 2024-03-08 NOTE — Progress Notes (Signed)
 This patient returns to my office for at risk foot care.  This patient requires this care by a professional since this patient will be at risk due to having diabetes with no complications.  This patient is unable to cut nails herself since the patient cannot reach her nails.These nails are painful walking and wearing shoes.  This patient presents for at risk foot care today.  General Appearance  Alert, conversant and in no acute stress.  Vascular  Dorsalis pedis and posterior tibial  pulses are palpable  bilaterally.  Capillary return is within normal limits  bilaterally. Temperature is within normal limits  bilaterally.  Neurologic  Senn-Weinstein monofilament wire test within normal limits  bilaterally. Muscle power within normal limits bilaterally.  Nails Thick disfigured discolored nails with subungual debris  from hallux to fifth toes bilaterally. No evidence of bacterial infection or drainage bilaterally.  Orthopedic  No limitations of motion  feet .  No crepitus or effusions noted.  No bony pathology or digital deformities noted. Midfoot DJD. Plantar positioning of spur left foot.  Skin  normotropic skin with no porokeratosis noted bilaterally.  No signs of infections or ulcers noted.     Onychomycosis  Pain in right toes  Pain in left toes  Consent was obtained for treatment procedures.   Mechanical debridement of nails 1-5  bilaterally performed with a nail nipper.  Filed with dremel without incident.    Return office visit    3  months                  Told patient to return for periodic foot care and evaluation due to potential at risk complications.   Helane Gunther DPM

## 2024-03-09 ENCOUNTER — Other Ambulatory Visit: Payer: Self-pay | Admitting: Internal Medicine

## 2024-03-09 DIAGNOSIS — E039 Hypothyroidism, unspecified: Secondary | ICD-10-CM

## 2024-04-06 ENCOUNTER — Other Ambulatory Visit: Payer: Self-pay | Admitting: Orthopedic Surgery

## 2024-04-06 NOTE — Telephone Encounter (Signed)
 Patient is requesting refill on medication that's not on list. Medication pend and sent to PCP Marguerite Shiley, MD for approval.

## 2024-04-14 ENCOUNTER — Other Ambulatory Visit: Payer: Self-pay | Admitting: Internal Medicine

## 2024-06-08 ENCOUNTER — Ambulatory Visit (INDEPENDENT_AMBULATORY_CARE_PROVIDER_SITE_OTHER): Payer: MEDICARE | Admitting: Podiatry

## 2024-06-08 ENCOUNTER — Encounter: Payer: Self-pay | Admitting: Podiatry

## 2024-06-08 DIAGNOSIS — M79674 Pain in right toe(s): Secondary | ICD-10-CM

## 2024-06-08 DIAGNOSIS — B351 Tinea unguium: Secondary | ICD-10-CM

## 2024-06-08 DIAGNOSIS — E119 Type 2 diabetes mellitus without complications: Secondary | ICD-10-CM

## 2024-06-08 DIAGNOSIS — M79675 Pain in left toe(s): Secondary | ICD-10-CM

## 2024-06-08 NOTE — Progress Notes (Signed)
 This patient returns to my office for at risk foot care.  This patient requires this care by a professional since this patient will be at risk due to having diabetes with no complications.  This patient is unable to cut nails herself since the patient cannot reach her nails.These nails are painful walking and wearing shoes.  This patient presents for at risk foot care today.  General Appearance  Alert, conversant and in no acute stress.  Vascular  Dorsalis pedis and posterior tibial  pulses are palpable  bilaterally.  Capillary return is within normal limits  bilaterally. Temperature is within normal limits  bilaterally.  Neurologic  Senn-Weinstein monofilament wire test within normal limits  bilaterally. Muscle power within normal limits bilaterally.  Nails Thick disfigured discolored nails with subungual debris  from hallux to fifth toes bilaterally. No evidence of bacterial infection or drainage bilaterally.  Orthopedic  No limitations of motion  feet .  No crepitus or effusions noted.  No bony pathology or digital deformities noted. Midfoot DJD. Plantar positioning of spur left foot.  Skin  normotropic skin with no porokeratosis noted bilaterally.  No signs of infections or ulcers noted.     Onychomycosis  Pain in right toes  Pain in left toes  Consent was obtained for treatment procedures.   Mechanical debridement of nails 1-5  bilaterally performed with a nail nipper.  Filed with dremel without incident.    Return office visit    3  months                  Told patient to return for periodic foot care and evaluation due to potential at risk complications.   Helane Gunther DPM

## 2024-07-04 LAB — COMPLETE METABOLIC PANEL WITHOUT GFR
AG Ratio: 1.5 (calc) (ref 1.0–2.5)
ALT: 14 U/L (ref 6–29)
AST: 17 U/L (ref 10–35)
Albumin: 4.1 g/dL (ref 3.6–5.1)
Alkaline phosphatase (APISO): 60 U/L (ref 37–153)
BUN: 16 mg/dL (ref 7–25)
CO2: 30 mmol/L (ref 20–32)
Calcium: 9.4 mg/dL (ref 8.6–10.4)
Chloride: 101 mmol/L (ref 98–110)
Creat: 0.73 mg/dL (ref 0.60–0.95)
Globulin: 2.8 g/dL (ref 1.9–3.7)
Glucose, Bld: 128 mg/dL — ABNORMAL HIGH (ref 65–99)
Potassium: 3.7 mmol/L (ref 3.5–5.3)
Sodium: 140 mmol/L (ref 135–146)
Total Bilirubin: 0.6 mg/dL (ref 0.2–1.2)
Total Protein: 6.9 g/dL (ref 6.1–8.1)

## 2024-07-04 LAB — CBC WITH DIFFERENTIAL/PLATELET
Absolute Lymphocytes: 2519 {cells}/uL (ref 850–3900)
Absolute Monocytes: 808 {cells}/uL (ref 200–950)
Basophils Absolute: 56 {cells}/uL (ref 0–200)
Basophils Relative: 0.6 %
Eosinophils Absolute: 244 {cells}/uL (ref 15–500)
Eosinophils Relative: 2.6 %
HCT: 40.2 % (ref 35.0–45.0)
Hemoglobin: 13.1 g/dL (ref 11.7–15.5)
MCH: 27.8 pg (ref 27.0–33.0)
MCHC: 32.6 g/dL (ref 32.0–36.0)
MCV: 85.4 fL (ref 80.0–100.0)
MPV: 10.1 fL (ref 7.5–12.5)
Monocytes Relative: 8.6 %
Neutro Abs: 5772 {cells}/uL (ref 1500–7800)
Neutrophils Relative %: 61.4 %
Platelets: 274 Thousand/uL (ref 140–400)
RBC: 4.71 Million/uL (ref 3.80–5.10)
RDW: 13 % (ref 11.0–15.0)
Total Lymphocyte: 26.8 %
WBC: 9.4 Thousand/uL (ref 3.8–10.8)

## 2024-07-04 LAB — HEMOGLOBIN A1C
Hgb A1c MFr Bld: 6.8 % — ABNORMAL HIGH (ref <5.7)
Mean Plasma Glucose: 148 mg/dL
eAG (mmol/L): 8.2 mmol/L

## 2024-07-05 ENCOUNTER — Encounter: Payer: Self-pay | Admitting: Internal Medicine

## 2024-07-05 ENCOUNTER — Non-Acute Institutional Stay: Payer: MEDICARE | Admitting: Internal Medicine

## 2024-07-05 VITALS — BP 142/70 | HR 78 | Temp 97.3°F | Ht 64.0 in | Wt 176.6 lb

## 2024-07-05 DIAGNOSIS — E1169 Type 2 diabetes mellitus with other specified complication: Secondary | ICD-10-CM | POA: Diagnosis not present

## 2024-07-05 DIAGNOSIS — M25561 Pain in right knee: Secondary | ICD-10-CM

## 2024-07-05 DIAGNOSIS — G8929 Other chronic pain: Secondary | ICD-10-CM

## 2024-07-05 DIAGNOSIS — Z1231 Encounter for screening mammogram for malignant neoplasm of breast: Secondary | ICD-10-CM

## 2024-07-05 DIAGNOSIS — E039 Hypothyroidism, unspecified: Secondary | ICD-10-CM

## 2024-07-05 DIAGNOSIS — Z23 Encounter for immunization: Secondary | ICD-10-CM

## 2024-07-05 DIAGNOSIS — E2839 Other primary ovarian failure: Secondary | ICD-10-CM

## 2024-07-05 MED ORDER — OFLOXACIN 0.3 % OP SOLN: 1.0000 [drp] | Freq: Four times a day (QID) | OPHTHALMIC | 0 refills | Status: DC

## 2024-07-05 MED ORDER — COVID-19 MRNA VACCINE (PFIZER) 30 MCG/0.3ML IM SUSP: 0.3000 mL | Freq: Once | INTRAMUSCULAR | 0 refills | Status: AC

## 2024-07-05 NOTE — Progress Notes (Unsigned)
 [facility]  [facility] (12)  Provider:   Code Status: *** Goals of Care:     12/22/2023    1:28 PM  Advanced Directives  Does Patient Have a Medical Advance Directive? Yes  Type of Estate agent of [place];Living will  Copy of Healthcare Power of Attorney in Chart? No - copy requested     Chief Complaint  Patient presents with   Medical Management of Chronic Issues    4 month f/u with labs. Need for shingrix(not sure if she wants), flu(getting here 10/8), covid(getting here as well). Right eye leaking and sticky. Issues with right knee. Spinal stenosis acting up.    HPI: Patient is a 83 y.o. female seen today for medical management of chronic diseases.     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   Hypertension    Hypothyroidism     Past Surgical History:  Procedure Laterality Date   CATARACT EXTRACTION Bilateral 2016   CHOLECYSTECTOMY  1983   COLONOSCOPY  2018   Juliane VEAR Fisherman, MD/Per new patient packet   DG PORTABLE CHEST X RAY ([facility] HX)  2023   Shoulder/Per new patient packet   DIAGNOSTIC MAMMOGRAM  2022   Per new patient packet   REVERSE SHOULDER ARTHROPLASTY Right 07/30/2022   Procedure: REVERSE SHOULDER ARTHROPLASTY;  Surgeon: Melita Drivers, MD;  Location: [facility];  Service: Orthopedics;  Laterality: Right;    TOTAL KNEE ARTHROPLASTY Left 2017   TOTAL SHOULDER ARTHROPLASTY  07/30/2022    Allergies  Allergen Reactions   Celecoxib Diarrhea    Other reaction(s): other   Quinidine Diarrhea   Codeine Rash    Outpatient Encounter Medications as of 07/05/2024  Medication Sig   acetaminophen  (TYLENOL ) 500 MG tablet Take 1,000 mg by mouth as needed.   Calcium  Carbonate-Vitamin D  600-5 MG-MCG TABS Take 1 tablet by mouth daily.   famotidine (PEPCID AC MAXIMUM STRENGTH) 20 MG tablet Take 20 mg by mouth daily.   fluticasone (FLONASE) 50 MCG/ACT nasal spray Place 1 spray into both nostrils daily as needed for allergies.   Iron Combinations (IRON COMPLEX PO) Take 60 mg by mouth 3 (three) times a week. Mon, Wed, Fri   levothyroxine  (SYNTHROID ) 100 MCG tablet TAKE 1 TABLET BY MOUTH EVERY DAY   losartan -hydrochlorothiazide  (HYZAAR ) 100-25 MG tablet TAKE 1 TABLET BY MOUTH EVERY DAY IN THE MORNING   Menthol, Topical Analgesic, (BIOFREEZE) 10 % CREA Apply 1 Application topically as needed.   oxybutynin  (DITROPAN -XL) 5 MG 24 hr tablet TAKE 1 TABLET BY MOUTH EVERYDAY AT BEDTIME   rosuvastatin  (CRESTOR ) 20 MG tablet TAKE 1 TABLET BY MOUTH EVERYDAY AT BEDTIME   Semaglutide , 2 MG/DOSE, 8 MG/3ML SOPN Inject 2 mg as directed once a week.   traZODone  (DESYREL ) 50 MG tablet TAKE 0.5 TABLETS BY MOUTH AT BEDTIME.   amoxicillin (AMOXIL) 500 MG tablet Take 500 mg by mouth. Dental Prophlaxis (Patient not taking: Reported on 07/05/2024)   estradiol  (ESTRACE ) 0.1 MG/GM vaginal cream Place 1 Applicatorful vaginally 2 (two) times a week. (Patient not taking: Reported on 07/05/2024)   JANUVIA  100 MG tablet TAKE 1 TABLET BY MOUTH EVERY DAY (Patient not taking: Reported on 07/05/2024)  No facility-administered encounter medications on file as of 07/05/2024.    Review of Systems:  Review of Systems  Health Maintenance  Topic Date Due   Diabetic kidney evaluation - Urine ACR  Never done   Zoster Vaccines- Shingrix (1 of 2) 08/01/1993   DEXA SCAN  Never done   FOOT EXAM  02/11/2023   OPHTHALMOLOGY EXAM  04/25/2024   Influenza Vaccine  08/25/2024 (Originally 05/26/2024)   COVID-19 Vaccine (8 - 2024-25 season) 08/25/2024 (Originally 06/26/2024)   Medicare Annual Wellness (AWV)  09/15/2024   HEMOGLOBIN A1C  12/31/2024   Diabetic kidney evaluation - eGFR measurement  07/03/2025   DTaP/Tdap/Td (3 - Td or  Tdap) 05/23/2031   Pneumococcal Vaccine: 50+ Years  Completed   HPV VACCINES  Aged Out   Meningococcal B Vaccine  Aged Out    Physical Exam: Vitals:   07/04/24 1643 07/05/24 1002  BP: (!) 180/98 (!) 142/70  Pulse: 78   Temp: (!) 97.3 F (36.3 C)   SpO2: 98%   Weight: 176 lb 9.6 oz (80.1 kg)   Height: 5' 4 (1.626 m)    Body mass index is 30.31 kg/m. Physical Exam  Labs reviewed: Basic Metabolic Panel: Recent Labs    08/26/23 0800 02/17/24 0816 07/03/24 0820  NA 140 137 140  K 4.0 3.9 3.7  CL 100 101 101  CO2 30 28 30   GLUCOSE 130* 124* 128*  BUN 14 18 16   CREATININE 0.87 0.77 0.73  CALCIUM  9.9 9.7 9.4  TSH 0.86 0.59  --    Liver Function Tests: Recent Labs    08/26/23 0800 02/17/24 0816 07/03/24 0820  AST 17 17 17   ALT 15 14 14   BILITOT 0.5 0.6 0.6  PROT 6.9 6.8 6.9   No results for input(s): LIPASE, AMYLASE in the last 8760 hours. No results for input(s): AMMONIA in the last 8760 hours. CBC: Recent Labs    08/26/23 0800 02/17/24 0816 07/03/24 0820  WBC 7.0 6.5 9.4  NEUTROABS 3,948 3,692 5,772  HGB 11.5* 13.2 13.1  HCT 36.3 40.6 40.2  MCV 80.1 83.0 85.4  PLT 319 274 274   Lipid Panel: Recent Labs    08/26/23 0800 02/17/24 0816  CHOL 104 128  HDL 41* 45*  LDLCALC 40 61  TRIG 156* 132  CHOLHDL 2.5 2.8   Lab Results  Component Value Date   HGBA1C 6.8 (H) 07/03/2024    Procedures since last visit: No results found.  Assessment/Plan 1. Immunization due (Primary) ***    Labs/tests ordered:  * No order type specified * Next appt:  Visit date not found

## 2024-07-05 NOTE — Patient Instructions (Addendum)
 Call [facility]  Can use Voltaren  Ointment PRN for Back pain  I have made referral to [facility] for your Mammogram and DEXA  Reduce your B 12 to one a week  Please have your labs done here at [facility] on 10/23/24 from 7:45-8:15.

## 2024-07-20 ENCOUNTER — Other Ambulatory Visit: Payer: Self-pay | Admitting: Internal Medicine

## 2024-08-08 ENCOUNTER — Other Ambulatory Visit: Payer: Self-pay | Admitting: Internal Medicine

## 2024-08-08 DIAGNOSIS — E039 Hypothyroidism, unspecified: Secondary | ICD-10-CM

## 2024-08-31 ENCOUNTER — Other Ambulatory Visit: Payer: Self-pay | Admitting: Internal Medicine

## 2024-09-07 ENCOUNTER — Ambulatory Visit: Payer: MEDICARE | Admitting: Podiatry

## 2024-09-12 ENCOUNTER — Encounter: Payer: Self-pay | Admitting: Podiatry

## 2024-09-12 ENCOUNTER — Ambulatory Visit (INDEPENDENT_AMBULATORY_CARE_PROVIDER_SITE_OTHER): Payer: MEDICARE | Admitting: Podiatry

## 2024-09-12 DIAGNOSIS — B351 Tinea unguium: Secondary | ICD-10-CM

## 2024-09-12 DIAGNOSIS — M79674 Pain in right toe(s): Secondary | ICD-10-CM

## 2024-09-12 DIAGNOSIS — M79675 Pain in left toe(s): Secondary | ICD-10-CM

## 2024-09-12 DIAGNOSIS — E119 Type 2 diabetes mellitus without complications: Secondary | ICD-10-CM | POA: Diagnosis not present

## 2024-09-12 NOTE — Progress Notes (Signed)
 This patient returns to my office for at risk foot care.  This patient requires this care by a professional since this patient will be at risk due to having diabetes with no complications.  This patient is unable to cut nails herself since the patient cannot reach her nails.These nails are painful walking and wearing shoes.  This patient presents for at risk foot care today.  General Appearance  Alert, conversant and in no acute stress.  Vascular  Dorsalis pedis and posterior tibial  pulses are palpable  bilaterally.  Capillary return is within normal limits  bilaterally. Temperature is within normal limits  bilaterally.  Neurologic  Senn-Weinstein monofilament wire test within normal limits  bilaterally. Muscle power within normal limits bilaterally.  Nails Thick disfigured discolored nails with subungual debris  from hallux to fifth toes bilaterally. No evidence of bacterial infection or drainage bilaterally.  Orthopedic  No limitations of motion  feet .  No crepitus or effusions noted.  No bony pathology or digital deformities noted. Midfoot DJD. Plantar positioning of spur left foot.  Skin  normotropic skin with no porokeratosis noted bilaterally.  No signs of infections or ulcers noted.     Onychomycosis  Pain in right toes  Pain in left toes  Consent was obtained for treatment procedures.   Mechanical debridement of nails 1-5  bilaterally performed with a nail nipper.  Filed with dremel without incident.    Return office visit    3  months                  Told patient to return for periodic foot care and evaluation due to potential at risk complications.   Helane Gunther DPM

## 2024-09-15 ENCOUNTER — Ambulatory Visit
Admission: RE | Admit: 2024-09-15 | Discharge: 2024-09-15 | Disposition: A | Discharge status: Home / Self Care | Payer: MEDICARE | Source: Ambulatory Visit | Attending: Internal Medicine | Admitting: Internal Medicine

## 2024-09-15 DIAGNOSIS — Z1231 Encounter for screening mammogram for malignant neoplasm of breast: Secondary | ICD-10-CM

## 2024-10-31 LAB — COMPLETE METABOLIC PANEL WITHOUT GFR
AG Ratio: 1.4 (calc) (ref 1.0–2.5)
ALT: 18 U/L (ref 6–29)
AST: 19 U/L (ref 10–35)
Albumin: 3.9 g/dL (ref 3.6–5.1)
Alkaline phosphatase (APISO): 54 U/L (ref 37–153)
BUN: 18 mg/dL (ref 7–25)
CO2: 29 mmol/L (ref 20–32)
Calcium: 9.1 mg/dL (ref 8.6–10.4)
Chloride: 104 mmol/L (ref 98–110)
Creat: 0.81 mg/dL (ref 0.60–0.95)
Globulin: 2.8 g/dL (ref 1.9–3.7)
Glucose, Bld: 134 mg/dL — ABNORMAL HIGH (ref 65–99)
Potassium: 3.7 mmol/L (ref 3.5–5.3)
Sodium: 140 mmol/L (ref 135–146)
Total Bilirubin: 0.5 mg/dL (ref 0.2–1.2)
Total Protein: 6.7 g/dL (ref 6.1–8.1)

## 2024-10-31 LAB — CBC WITH DIFFERENTIAL/PLATELET
Absolute Lymphocytes: 2408 {cells}/uL (ref 850–3900)
Absolute Monocytes: 683 {cells}/uL (ref 200–950)
Basophils Absolute: 68 {cells}/uL (ref 0–200)
Basophils Relative: 0.9 %
Eosinophils Absolute: 263 {cells}/uL (ref 15–500)
Eosinophils Relative: 3.5 %
HCT: 39.2 % (ref 35.9–46.0)
Hemoglobin: 12.8 g/dL (ref 11.7–15.5)
MCH: 27.6 pg (ref 27.0–33.0)
MCHC: 32.7 g/dL (ref 31.6–35.4)
MCV: 84.5 fL (ref 81.4–101.7)
MPV: 10.2 fL (ref 7.5–12.5)
Monocytes Relative: 9.1 %
Neutro Abs: 4080 {cells}/uL (ref 1500–7800)
Neutrophils Relative %: 54.4 %
Platelets: 276 Thousand/uL (ref 140–400)
RBC: 4.64 Million/uL (ref 3.80–5.10)
RDW: 13.4 % (ref 11.0–15.0)
Total Lymphocyte: 32.1 %
WBC: 7.5 Thousand/uL (ref 3.8–10.8)

## 2024-10-31 LAB — HEMOGLOBIN A1C
Hgb A1c MFr Bld: 6.8 % — ABNORMAL HIGH
Mean Plasma Glucose: 148 mg/dL
eAG (mmol/L): 8.2 mmol/L

## 2024-10-31 LAB — LIPID PANEL
Cholesterol: 112 mg/dL
HDL: 49 mg/dL — ABNORMAL LOW
LDL Cholesterol (Calc): 44 mg/dL
Non-HDL Cholesterol (Calc): 63 mg/dL
Total CHOL/HDL Ratio: 2.3 (calc)
Triglycerides: 104 mg/dL

## 2024-10-31 LAB — TSH: TSH: 0.18 m[IU]/L — ABNORMAL LOW (ref 0.40–4.50)

## 2024-11-01 ENCOUNTER — Non-Acute Institutional Stay: Payer: MEDICARE | Admitting: Internal Medicine

## 2024-11-01 ENCOUNTER — Encounter: Payer: Self-pay | Admitting: Internal Medicine

## 2024-11-01 VITALS — BP 130/60 | HR 76 | Temp 98.7°F | Ht 64.0 in | Wt 178.9 lb

## 2024-11-01 DIAGNOSIS — G6289 Other specified polyneuropathies: Secondary | ICD-10-CM

## 2024-11-01 DIAGNOSIS — M25561 Pain in right knee: Secondary | ICD-10-CM | POA: Diagnosis not present

## 2024-11-01 DIAGNOSIS — E1169 Type 2 diabetes mellitus with other specified complication: Secondary | ICD-10-CM | POA: Diagnosis not present

## 2024-11-01 DIAGNOSIS — E039 Hypothyroidism, unspecified: Secondary | ICD-10-CM

## 2024-11-01 DIAGNOSIS — G8929 Other chronic pain: Secondary | ICD-10-CM | POA: Diagnosis not present

## 2024-11-01 DIAGNOSIS — E2839 Other primary ovarian failure: Secondary | ICD-10-CM

## 2024-11-01 DIAGNOSIS — Z7985 Long-term (current) use of injectable non-insulin antidiabetic drugs: Secondary | ICD-10-CM

## 2024-11-01 MED ORDER — TRIAMCINOLONE ACETONIDE 0.5 % EX OINT: 1.0000 | TOPICAL_OINTMENT | Freq: Two times a day (BID) | CUTANEOUS | 0 refills | Status: AC

## 2024-11-01 MED ORDER — GABAPENTIN 100 MG PO CAPS: 100.0000 mg | ORAL_CAPSULE | Freq: Three times a day (TID) | ORAL | 3 refills | Status: AC

## 2024-11-01 NOTE — Patient Instructions (Addendum)
 Shingles vaccine due. Medicare wellness due. Bone density due. Labs 2 days prior to next appointment scheduled.   The address for [facility] is: [facility] in [place], [place] [address], [place], [place] [address] [phone]

## 2024-11-02 LAB — OPHTHALMOLOGY REPORT-SCANNED

## 2024-11-05 NOTE — Progress Notes (Unsigned)
 "  Location:   [facility] Curator of Service:   Clinic  Provider:   Code Status:  Goals of Care:     12/22/2023    1:28 PM  Advanced Directives  Does Patient Have a Medical Advance Directive? Yes  Type of Estate Agent of [place];Living will  Copy of Healthcare Power of Attorney in Chart? No - copy requested     Chief Complaint  Patient presents with   medication management of chronic issues    4 month follow up. Discuss the need for bone density. Foot exam will be done the middle of next month per patient. Eye exam scheduled for 11/02/2024.    HPI: Patient is a 84 y.o. female seen today for medical management of chronic diseases.   Lives in [place]   Has history of type 2 diabetes, iron deficiency anemia, hypothyroidism, chronic arthritis, hyperlipidemia    patient was taken off metformin  due to diarrhea and started Ozempic .   Doing well  A1C is good limits  Not losing anymore weight But other wise doing well  Discussed the use of AI scribe software for clinical note transcription with the patient, who gave verbal consent to proceed.  History of Present Illness   April Wheeler is an 84 year old female with chronic itching and diabetes who presents with worsening itching and nerve pain.  She describes persistent generalized itching with small red dots that has recently worsened. Benadryl at night helps her sleep but she is looking for stronger relief. She scrubs the area and uses topical agents including Ceralyte, African black soap, and cortisone cream with only partial, temporary benefit.  She is on Ozempic  2 mg for diabetes and has stopped metformin . Her A1c is 6.8, up from 6.6. She notes occasional constipation and mild ache at the injection site but no other abdominal pain. She craves sweets after lunch and is concerned this may affect her glucose control.  She has neuropathic pain in the last three toes of the left foot,  mainly at night, described as painful with numbness. Voltaren  ointment has not helped. She also has chronic right foot pain from arthritis and a twisted toe.  She recently had a right knee injection and attends physical therapy twice weekly for chronic knee pain. She has known spinal stenosis without specialist follow-up and reports chronic right shoulder pain and a problematic left shoulder, without recent acute change.  She takes trazodone  50 mg nightly, which helps her sleep, though nocturia sometimes wakes her. She notes intermittent leg pain that settles enough to allow sleep and tingling and numbness in her feet that bother her when sitting and watching TV.  She has long-standing thyroid disease on a stable dose of 100 mcg taken early in the morning. She also takes iron three times weekly and B12 weekly, without recent changes or reported issues from these medications.       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   Hypertension    Hypothyroidism     Past Surgical History:  Procedure Laterality Date   CATARACT EXTRACTION Bilateral 2016   CHOLECYSTECTOMY  1983   COLONOSCOPY  2018   Juliane VEAR Fisherman, MD/Per new patient packet  DG PORTABLE CHEST X RAY ([facility] HX)  2023   Shoulder/Per new patient packet   DIAGNOSTIC MAMMOGRAM  2022   Per new patient packet   REVERSE SHOULDER ARTHROPLASTY Right 07/30/2022   Procedure: REVERSE SHOULDER ARTHROPLASTY;  Surgeon: Melita Drivers, MD;  Location: [facility];  Service: Orthopedics;  Laterality: Right;    TOTAL KNEE ARTHROPLASTY Left 2017   TOTAL SHOULDER ARTHROPLASTY  07/30/2022    Allergies[1]  Outpatient Encounter Medications as of 11/01/2024  Medication Sig   acetaminophen  (TYLENOL ) 500 MG tablet Take 1,000 mg by mouth as  needed.   Calcium  Carbonate-Vitamin D 600-5 MG-MCG TABS Take 1 tablet by mouth daily.   estradiol  (ESTRACE ) 0.1 MG/GM vaginal cream Place 1 Applicatorful vaginally 2 (two) times a week.   famotidine (PEPCID AC MAXIMUM STRENGTH) 20 MG tablet Take 20 mg by mouth daily.   fluticasone (FLONASE) 50 MCG/ACT nasal spray Place 1 spray into both nostrils daily as needed for allergies.   gabapentin  (NEURONTIN ) 100 MG capsule Take 1 capsule (100 mg total) by mouth 3 (three) times daily.   Iron Combinations (IRON COMPLEX PO) Take 60 mg by mouth 3 (three) times a week. Mon, Wed, Fri   levothyroxine  (SYNTHROID ) 100 MCG tablet TAKE 1 TABLET BY MOUTH EVERY DAY   losartan -hydrochlorothiazide  (HYZAAR ) 100-25 MG tablet TAKE 1 TABLET BY MOUTH EVERY DAY IN THE MORNING   Menthol, Topical Analgesic, (BIOFREEZE) 10 % CREA Apply 1 Application topically as needed.   ofloxacin  (OCUFLOX ) 0.3 % ophthalmic solution INSTILL 1 DROP INTO RIGHT EYE 4 TIMES A DAY   oxybutynin  (DITROPAN -XL) 5 MG 24 hr tablet TAKE 1 TABLET BY MOUTH EVERYDAY AT BEDTIME   rosuvastatin  (CRESTOR ) 20 MG tablet TAKE 1 TABLET BY MOUTH EVERYDAY AT BEDTIME   Semaglutide , 2 MG/DOSE, (OZEMPIC , 2 MG/DOSE,) 8 MG/3ML SOPN INJECT 2 MG INTO THE SKIN ONCE A WEEK.   traZODone  (DESYREL ) 50 MG tablet TAKE 1/2 TABLET BY MOUTH AT BEDTIME   triamcinolone  ointment (KENALOG ) 0.5 % Apply 1 Application topically 2 (two) times daily.   amoxicillin (AMOXIL) 500 MG tablet Take 500 mg by mouth. Dental Prophlaxis (Patient not taking: Reported on 09/12/2024)   No facility-administered encounter medications on file as of 11/01/2024.    Review of Systems:  Review of Systems  Unable to perform ROS: Other (as per HPI)    Health Maintenance  Topic Date Due   Diabetic kidney evaluation - Urine ACR  Never done   Bone Density Scan  Never done   FOOT EXAM  02/11/2023   Medicare Annual Wellness (AWV)  09/22/2023   OPHTHALMOLOGY EXAM  04/25/2024   Zoster Vaccines- Shingrix (1 of 2)  01/30/2025 (Originally 08/01/1991)   COVID-19 Vaccine (9 - 2025-26 season) 01/02/2025   HEMOGLOBIN A1C  04/29/2025   Diabetic kidney evaluation - eGFR measurement  10/30/2025   DTaP/Tdap/Td (3 - Td or Tdap) 05/23/2031   Pneumococcal Vaccine: 50+ Years  Completed   Influenza Vaccine  Completed   Meningococcal B Vaccine  Aged Out    Physical Exam: Vitals:   11/01/24 1043  BP: 130/60  Pulse: 76  Temp: 98.7 F (37.1 C)  SpO2: 97%  Weight: 178 lb 14.4 oz (81.1 kg)  Height: 5' 4 (1.626 m)   Body mass index is 30.71 kg/m. Physical Exam Vitals reviewed.  Constitutional:      Appearance: Normal appearance.  HENT:     Head: Normocephalic.     Nose: Nose normal.     Mouth/Throat:  Mouth: Mucous membranes are moist.     Pharynx: Oropharynx is clear.  Eyes:     Pupils: Pupils are equal, round, and reactive to light.  Cardiovascular:     Rate and Rhythm: Normal rate and regular rhythm.     Pulses: Normal pulses.     Heart sounds: Normal heart sounds. No murmur heard. Pulmonary:     Effort: Pulmonary effort is normal.     Breath sounds: Normal breath sounds.  Abdominal:     General: Abdomen is flat. Bowel sounds are normal.     Palpations: Abdomen is soft.  Musculoskeletal:        General: No swelling.     Cervical back: Neck supple.  Skin:    General: Skin is warm.  Neurological:     General: No focal deficit present.     Mental Status: She is alert and oriented to person, place, and time.  Psychiatric:        Mood and Affect: Mood normal.        Thought Content: Thought content normal.     Labs reviewed: Basic Metabolic Panel: Recent Labs    02/17/24 0816 07/03/24 0820 10/30/24 0808  NA 137 140 140  K 3.9 3.7 3.7  CL 101 101 104  CO2 28 30 29   GLUCOSE 124* 128* 134*  BUN 18 16 18   CREATININE 0.77 0.73 0.81  CALCIUM  9.7 9.4 9.1  TSH 0.59  --  0.18*   Liver Function Tests: Recent Labs    02/17/24 0816 07/03/24 0820 10/30/24 0808  AST 17 17 19    ALT 14 14 18   BILITOT 0.6 0.6 0.5  PROT 6.8 6.9 6.7   No results for input(s): LIPASE, AMYLASE in the last 8760 hours. No results for input(s): AMMONIA in the last 8760 hours. CBC: Recent Labs    02/17/24 0816 07/03/24 0820 10/30/24 0808  WBC 6.5 9.4 7.5  NEUTROABS 3,692 5,772 4,080  HGB 13.2 13.1 12.8  HCT 40.6 40.2 39.2  MCV 83.0 85.4 84.5  PLT 274 274 276   Lipid Panel: Recent Labs    02/17/24 0816 10/30/24 0808  CHOL 128 112  HDL 45* 49*  LDLCALC 61 44  TRIG 132 104  CHOLHDL 2.8 2.3   Lab Results  Component Value Date   HGBA1C 6.8 (H) 10/30/2024    Procedures since last visit: No results found.  Assessment/Plan Assessment and Plan    Chronic pruritus Chronic itching with tiny red dots, exacerbated by dry skin possibly due to diuretic and oxybutynin  use. - Prescribed Kenalog  ointment for topical application as needed. - Advised against using Benadryl due to potential skin dryness.  Type 2 diabetes mellitus with peripheral neuropathy Type 2 diabetes with A1c at 6.8, well-managed on Ozempic  2 mg. Peripheral neuropathy with nocturnal pain and numbness in the left foot. - Prescribed gabapentin  100 mg, to be taken as needed, especially at night. - Continue Ozempic  2 mg.  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 Right foot pain with arthritis and twisted toe, not relieved by Voltaren  ointment. - [place] . - Will consider gabapentin  for right foot pain management.  Lumbar spinal stenosis Chronic lumbar spinal stenosis with no current intervention. She is considering surgical options. - Discussed potential referral to [facility] for further evaluation.  Osteoarthritis of bilateral shoulders Chronic bilateral shoulder pain, with right shoulder under management and left  shoulder requiring potential  replacement. - Encouraged swimming to improve shoulder mobility.  Hypothyroidism Well-managed on levothyroxine  100 mcg. Recent TSH levels low, possibly due to medication timing or intake errors. - Will recheck TSH levels in three months.  Iron deficiency anemia Well-managed with current iron supplementation regimen. Hemoglobin levels are normal. - Continue current iron supplementation regimen.  General health maintenance Routine health maintenance discussed, including upcoming shingles vaccination and bone density screening. - Ordered bone density screening at [facility]. - Plan for shingles vaccination.      Chronic right shoulder pain, status post surgery   Chronic left shoulder pain   Chronic hip pain Primary insomnia Trazodone  PRN Hypertension On Hyzaar   Labs/tests ordered:  * No order type specified * Next appt:  01/24/2025        [1]  Allergies Allergen Reactions   Celecoxib Diarrhea    Other reaction(s): other   Quinidine Diarrhea   Codeine Rash   "

## 2024-11-07 ENCOUNTER — Other Ambulatory Visit: Payer: Self-pay | Admitting: Internal Medicine

## 2024-11-07 DIAGNOSIS — E2839 Other primary ovarian failure: Secondary | ICD-10-CM

## 2024-11-07 DIAGNOSIS — E1169 Type 2 diabetes mellitus with other specified complication: Secondary | ICD-10-CM

## 2024-11-07 DIAGNOSIS — E039 Hypothyroidism, unspecified: Secondary | ICD-10-CM

## 2024-11-07 DIAGNOSIS — E785 Hyperlipidemia, unspecified: Secondary | ICD-10-CM

## 2024-11-10 ENCOUNTER — Other Ambulatory Visit: Payer: MEDICARE

## 2024-11-10 ENCOUNTER — Ambulatory Visit: Payer: MEDICARE

## 2024-12-13 ENCOUNTER — Ambulatory Visit: Payer: MEDICARE | Admitting: Podiatry

## 2025-01-24 ENCOUNTER — Encounter: Payer: MEDICARE | Admitting: Internal Medicine

## 2025-06-27 ENCOUNTER — Other Ambulatory Visit (HOSPITAL_BASED_OUTPATIENT_CLINIC_OR_DEPARTMENT_OTHER): Payer: MEDICARE
